# Patient Record
Sex: Male | Born: 1945 | Race: White | Hispanic: No | Marital: Married | Smoking: Current every day smoker
Health system: Southern US, Community
[De-identification: ages and names within clinical notes are randomized; demographics above are authoritative.]

## PROBLEM LIST (undated history)

## (undated) DIAGNOSIS — I10 Essential (primary) hypertension: Secondary | ICD-10-CM

## (undated) DIAGNOSIS — F0390 Unspecified dementia without behavioral disturbance: Secondary | ICD-10-CM

## (undated) DIAGNOSIS — J449 Chronic obstructive pulmonary disease, unspecified: Secondary | ICD-10-CM

## (undated) DIAGNOSIS — N289 Disorder of kidney and ureter, unspecified: Secondary | ICD-10-CM

## (undated) DIAGNOSIS — E785 Hyperlipidemia, unspecified: Secondary | ICD-10-CM

## (undated) HISTORY — PX: COLONOSCOPY W/ POLYPECTOMY: SHX1380

## (undated) HISTORY — PX: KNEE SURGERY: SHX244

## (undated) HISTORY — PX: VASECTOMY: SHX75

## (undated) HISTORY — PX: ABDOMINAL AORTIC ANEURYSM REPAIR: SUR1152

---

## 1998-11-29 ENCOUNTER — Emergency Department (HOSPITAL_COMMUNITY): Admission: EM | Admit: 1998-11-29 | Discharge: 1998-11-29 | Payer: Self-pay | Admitting: *Deleted

## 1998-12-30 ENCOUNTER — Encounter: Admission: RE | Admit: 1998-12-30 | Discharge: 1999-03-30 | Payer: Self-pay | Admitting: Anesthesiology

## 2009-01-04 ENCOUNTER — Emergency Department (HOSPITAL_BASED_OUTPATIENT_CLINIC_OR_DEPARTMENT_OTHER): Admission: EM | Admit: 2009-01-04 | Discharge: 2009-01-04 | Payer: Self-pay | Admitting: Emergency Medicine

## 2009-06-10 ENCOUNTER — Ambulatory Visit: Payer: Self-pay | Admitting: Radiology

## 2009-06-10 ENCOUNTER — Emergency Department (HOSPITAL_BASED_OUTPATIENT_CLINIC_OR_DEPARTMENT_OTHER): Admission: EM | Admit: 2009-06-10 | Discharge: 2009-06-10 | Payer: Self-pay | Admitting: Emergency Medicine

## 2010-08-09 ENCOUNTER — Ambulatory Visit: Payer: Self-pay | Admitting: Radiology

## 2010-08-09 ENCOUNTER — Emergency Department (HOSPITAL_BASED_OUTPATIENT_CLINIC_OR_DEPARTMENT_OTHER): Admission: EM | Admit: 2010-08-09 | Discharge: 2010-08-09 | Payer: Self-pay | Admitting: Emergency Medicine

## 2010-08-11 ENCOUNTER — Emergency Department (HOSPITAL_BASED_OUTPATIENT_CLINIC_OR_DEPARTMENT_OTHER): Admission: EM | Admit: 2010-08-11 | Discharge: 2010-08-11 | Payer: Self-pay | Admitting: Emergency Medicine

## 2011-01-27 LAB — BASIC METABOLIC PANEL
BUN: 20 mg/dL (ref 6–23)
Calcium: 9.7 mg/dL (ref 8.4–10.5)
Creatinine, Ser: 0.9 mg/dL (ref 0.4–1.5)
GFR calc Af Amer: >60 mL/min (ref >60)

## 2011-01-27 LAB — CBC
Platelets: 373 10*3/uL (ref 150–400)
RBC: 5.15 MIL/uL (ref 4.22–5.81)
RDW: 12.6 % (ref 11.5–15.5)
WBC: 10.3 10*3/uL (ref 4.0–10.5)

## 2011-01-27 LAB — HEPATIC FUNCTION PANEL
ALT: 19 U/L (ref 0–53)
Albumin: 3.7 g/dL (ref 3.5–5.2)
Alkaline Phosphatase: 38 U/L — ABNORMAL LOW (ref 39–117)
Total Bilirubin: 0.8 mg/dL (ref 0.3–1.2)
Total Protein: 7.3 g/dL (ref 6.0–8.3)

## 2011-01-27 LAB — URINALYSIS, ROUTINE W REFLEX MICROSCOPIC
Bilirubin Urine: NEGATIVE
Ketones, ur: NEGATIVE mg/dL
Nitrite: NEGATIVE
Urobilinogen, UA: 0.2 mg/dL (ref 0.0–1.0)
pH: 6 (ref 5.0–8.0)

## 2011-01-27 LAB — LIPASE, BLOOD: Lipase: 175 U/L (ref 23–300)

## 2011-01-27 LAB — POCT CARDIAC MARKERS
CKMB, poc: 1.1 ng/mL (ref 1.0–8.0)
Troponin i, poc: <0.05 ng/mL (ref 0.00–0.09)

## 2011-02-20 LAB — URINE MICROSCOPIC-ADD ON

## 2011-02-20 LAB — URINALYSIS, ROUTINE W REFLEX MICROSCOPIC
Bilirubin Urine: NEGATIVE
Leukocytes, UA: NEGATIVE
Nitrite: NEGATIVE
Specific Gravity, Urine: 1.022 (ref 1.005–1.030)
Urobilinogen, UA: 1 mg/dL (ref 0.0–1.0)
pH: 6.5 (ref 5.0–8.0)

## 2011-02-20 LAB — DIFFERENTIAL
Basophils Relative: 1 % (ref 0–1)
Lymphocytes Relative: 19 % (ref 12–46)
Lymphs Abs: 2.8 10*3/uL (ref 0.7–4.0)
Monocytes Absolute: 1.1 10*3/uL — ABNORMAL HIGH (ref 0.1–1.0)
Monocytes Relative: 8 % (ref 3–12)
Neutro Abs: 10.3 10*3/uL — ABNORMAL HIGH (ref 1.7–7.7)
Neutrophils Relative %: 71 % (ref 43–77)

## 2011-02-20 LAB — POCT TOXICOLOGY PANEL

## 2011-02-20 LAB — POCT CARDIAC MARKERS
CKMB, poc: 1 ng/mL (ref 1.0–8.0)
Myoglobin, poc: 47.7 ng/mL (ref 12–200)

## 2011-02-20 LAB — COMPREHENSIVE METABOLIC PANEL
Albumin: 3.6 g/dL (ref 3.5–5.2)
Alkaline Phosphatase: 60 U/L (ref 39–117)
BUN: 22 mg/dL (ref 6–23)
Calcium: 9.3 mg/dL (ref 8.4–10.5)
Creatinine, Ser: 0.9 mg/dL (ref 0.4–1.5)
Glucose, Bld: 78 mg/dL (ref 70–99)
Potassium: 4.1 mEq/L (ref 3.5–5.1)
Total Protein: 6.7 g/dL (ref 6.0–8.3)

## 2011-02-20 LAB — CBC
HCT: 46.8 % (ref 39.0–52.0)
Hemoglobin: 16 g/dL (ref 13.0–17.0)
MCHC: 34.2 g/dL (ref 30.0–36.0)
MCV: 93.2 fL (ref 78.0–100.0)
Platelets: 315 10*3/uL (ref 150–400)
RDW: 12.7 % (ref 11.5–15.5)

## 2011-02-20 LAB — APTT: aPTT: 34 seconds (ref 24–37)

## 2011-02-20 LAB — PROTIME-INR: INR: 1 (ref 0.00–1.49)

## 2011-03-01 LAB — URINE MICROSCOPIC-ADD ON

## 2011-03-01 LAB — URINALYSIS, ROUTINE W REFLEX MICROSCOPIC
Glucose, UA: NEGATIVE mg/dL
Leukocytes, UA: NEGATIVE
pH: 6 (ref 5.0–8.0)

## 2011-05-12 ENCOUNTER — Emergency Department (HOSPITAL_BASED_OUTPATIENT_CLINIC_OR_DEPARTMENT_OTHER)
Admission: EM | Admit: 2011-05-12 | Discharge: 2011-05-12 | Disposition: A | Discharge status: Home / Self Care | Payer: BC Managed Care – PPO | Attending: Emergency Medicine | Admitting: Emergency Medicine

## 2011-05-12 DIAGNOSIS — T783XXA Angioneurotic edema, initial encounter: Secondary | ICD-10-CM | POA: Insufficient documentation

## 2011-05-12 DIAGNOSIS — R220 Localized swelling, mass and lump, head: Secondary | ICD-10-CM | POA: Insufficient documentation

## 2011-05-12 DIAGNOSIS — F341 Dysthymic disorder: Secondary | ICD-10-CM | POA: Insufficient documentation

## 2011-05-12 DIAGNOSIS — Z79899 Other long term (current) drug therapy: Secondary | ICD-10-CM | POA: Insufficient documentation

## 2011-05-12 DIAGNOSIS — Y92009 Unspecified place in unspecified non-institutional (private) residence as the place of occurrence of the external cause: Secondary | ICD-10-CM | POA: Insufficient documentation

## 2011-05-12 DIAGNOSIS — E785 Hyperlipidemia, unspecified: Secondary | ICD-10-CM | POA: Insufficient documentation

## 2011-05-12 DIAGNOSIS — I10 Essential (primary) hypertension: Secondary | ICD-10-CM | POA: Insufficient documentation

## 2011-05-12 DIAGNOSIS — X58XXXA Exposure to other specified factors, initial encounter: Secondary | ICD-10-CM | POA: Insufficient documentation

## 2013-01-20 ENCOUNTER — Emergency Department (HOSPITAL_BASED_OUTPATIENT_CLINIC_OR_DEPARTMENT_OTHER)
Admission: EM | Admit: 2013-01-20 | Discharge: 2013-01-20 | Disposition: A | Discharge status: Home / Self Care | Payer: Medicare Other | Attending: Emergency Medicine | Admitting: Emergency Medicine

## 2013-01-20 ENCOUNTER — Emergency Department (HOSPITAL_BASED_OUTPATIENT_CLINIC_OR_DEPARTMENT_OTHER): Payer: Medicare Other

## 2013-01-20 ENCOUNTER — Encounter (HOSPITAL_BASED_OUTPATIENT_CLINIC_OR_DEPARTMENT_OTHER): Payer: Self-pay

## 2013-01-20 DIAGNOSIS — Z7982 Long term (current) use of aspirin: Secondary | ICD-10-CM | POA: Insufficient documentation

## 2013-01-20 DIAGNOSIS — R112 Nausea with vomiting, unspecified: Secondary | ICD-10-CM | POA: Insufficient documentation

## 2013-01-20 DIAGNOSIS — E785 Hyperlipidemia, unspecified: Secondary | ICD-10-CM | POA: Insufficient documentation

## 2013-01-20 DIAGNOSIS — Z79899 Other long term (current) drug therapy: Secondary | ICD-10-CM | POA: Insufficient documentation

## 2013-01-20 DIAGNOSIS — R109 Unspecified abdominal pain: Secondary | ICD-10-CM | POA: Insufficient documentation

## 2013-01-20 DIAGNOSIS — F17200 Nicotine dependence, unspecified, uncomplicated: Secondary | ICD-10-CM | POA: Insufficient documentation

## 2013-01-20 DIAGNOSIS — I10 Essential (primary) hypertension: Secondary | ICD-10-CM | POA: Insufficient documentation

## 2013-01-20 HISTORY — DX: Hyperlipidemia, unspecified: E78.5

## 2013-01-20 HISTORY — DX: Essential (primary) hypertension: I10

## 2013-01-20 LAB — URINALYSIS, ROUTINE W REFLEX MICROSCOPIC
Glucose, UA: NEGATIVE mg/dL
Leukocytes, UA: NEGATIVE
pH: 6 (ref 5.0–8.0)

## 2013-01-20 LAB — URINE MICROSCOPIC-ADD ON

## 2013-01-20 LAB — CBC WITH DIFFERENTIAL/PLATELET
Basophils Absolute: 0.1 10*3/uL (ref 0.0–0.1)
HCT: 48.4 % (ref 39.0–52.0)
Hemoglobin: 17.1 g/dL — ABNORMAL HIGH (ref 13.0–17.0)
Lymphocytes Relative: 29 % (ref 12–46)
Monocytes Absolute: 1.8 10*3/uL — ABNORMAL HIGH (ref 0.1–1.0)
Monocytes Relative: 13 % — ABNORMAL HIGH (ref 3–12)
Neutro Abs: 8 10*3/uL — ABNORMAL HIGH (ref 1.7–7.7)
RBC: 5.16 MIL/uL (ref 4.22–5.81)
RDW: 14.2 % (ref 11.5–15.5)
WBC: 14.3 10*3/uL — ABNORMAL HIGH (ref 4.0–10.5)

## 2013-01-20 LAB — BASIC METABOLIC PANEL
CO2: 25 mEq/L (ref 19–32)
Chloride: 103 mEq/L (ref 96–112)
Creatinine, Ser: 0.9 mg/dL (ref 0.50–1.35)

## 2013-01-20 LAB — HEPATIC FUNCTION PANEL
AST: 28 U/L (ref 0–37)
Bilirubin, Direct: 0.1 mg/dL (ref 0.0–0.3)
Indirect Bilirubin: 0.4 mg/dL (ref 0.3–0.9)

## 2013-01-20 LAB — LIPASE, BLOOD: Lipase: 51 U/L (ref 11–59)

## 2013-01-20 MED ORDER — PROMETHAZINE HCL 25 MG PO TABS: 25.0000 mg | ORAL_TABLET | Freq: Four times a day (QID) | ORAL | Status: DC | PRN

## 2013-01-20 MED ORDER — FENTANYL CITRATE 0.05 MG/ML IJ SOLN
INTRAMUSCULAR | Status: AC
  Administered 2013-01-20: 100 ug via INTRAVENOUS
  Filled 2013-01-20: qty 2

## 2013-01-20 MED ORDER — HYDROMORPHONE HCL PF 1 MG/ML IJ SOLN: 1.0000 mg | Freq: Once | INTRAMUSCULAR | Status: AC

## 2013-01-20 MED ORDER — HYDROCODONE-ACETAMINOPHEN 5-325 MG PO TABS: 1.0000 | ORAL_TABLET | Freq: Four times a day (QID) | ORAL | Status: DC | PRN

## 2013-01-20 MED ORDER — SODIUM CHLORIDE 0.9 % IV BOLUS (SEPSIS)
1000.0000 mL | Freq: Once | INTRAVENOUS | Status: AC
  Administered 2013-01-20: 1000 mL via INTRAVENOUS

## 2013-01-20 MED ORDER — ONDANSETRON HCL 4 MG/2ML IJ SOLN: 4.0000 mg | Freq: Once | INTRAMUSCULAR | Status: AC

## 2013-01-20 MED ORDER — HYDROMORPHONE HCL PF 1 MG/ML IJ SOLN
INTRAMUSCULAR | Status: AC
  Administered 2013-01-20: 1 mg via INTRAVENOUS
  Filled 2013-01-20: qty 1

## 2013-01-20 MED ORDER — IOHEXOL 300 MG/ML  SOLN
100.0000 mL | Freq: Once | INTRAMUSCULAR | Status: AC | PRN
  Administered 2013-01-20: 100 mL via INTRAVENOUS

## 2013-01-20 MED ORDER — FENTANYL CITRATE 0.05 MG/ML IJ SOLN: 100.0000 ug | Freq: Once | INTRAMUSCULAR | Status: AC

## 2013-01-20 MED ORDER — ONDANSETRON HCL 4 MG/2ML IJ SOLN
INTRAMUSCULAR | Status: AC
  Administered 2013-01-20: 4 mg via INTRAVENOUS
  Filled 2013-01-20: qty 2

## 2013-01-20 MED ORDER — DIPHENHYDRAMINE HCL 50 MG/ML IJ SOLN
INTRAMUSCULAR | Status: AC
  Administered 2013-01-20: 12.5 mg via INTRAVENOUS
  Filled 2013-01-20: qty 1

## 2013-01-20 MED ORDER — DIPHENHYDRAMINE HCL 50 MG/ML IJ SOLN: 12.5000 mg | Freq: Once | INTRAMUSCULAR | Status: AC

## 2013-01-20 MED ORDER — IOHEXOL 300 MG/ML  SOLN
50.0000 mL | Freq: Once | INTRAMUSCULAR | Status: AC | PRN
  Administered 2013-01-20: 50 mL via ORAL

## 2013-01-20 NOTE — ED Provider Notes (Signed)
Medical screening examination/treatment/procedure(s) were performed by non-physician practitioner and as supervising physician I was immediately available for consultation/collaboration.  Douglas Delo, MD 01/20/13 1837 

## 2013-01-20 NOTE — ED Notes (Signed)
Chris, PA-C at bedside 

## 2013-01-20 NOTE — ED Notes (Signed)
Pt states that he has severe abdominal pain on the R side.  Pt states that he has nausea, but denies vomiting, diarrhea.  Pt states that he has chronic constipation.  Pts last BM was this morning.  Denies dysuria, hematuria.

## 2013-01-20 NOTE — ED Provider Notes (Signed)
History     CSN: [id number]  Arrival date & time 01/20/13  1106   First MD Initiated Contact with Patient 01/20/13 1200      Chief Complaint  Patient presents with  . Abdominal Pain    (Consider location/radiation/quality/duration/timing/severity/associated sxs/prior treatment) HPI Patient presents emergency department with mid abdominal pain, that began yesterday.  Patient, states, that he had a significant bowel movement this morning.  Patient, states he says, nausea and vomiting today, as well.  Patient denies headache, blurred vision, fever, or weakness, diarrhea, chest pain, shortness of breath, back pain, hematuria, or blood in stools.  Patient, states he did not take anything prior to arrival for his symptoms.  Patient, states, that movement and palpation seems make his pain, worse Past Medical History  Diagnosis Date  . Hyperlipidemia   . Hypertension     Past Surgical History  Procedure Laterality Date  . Abdominal aortic aneurysm repair    . Knee surgery      R knee  . Vasectomy    . Colonoscopy w/ polypectomy      No family history on file.  History  Substance Use Topics  . Smoking status: Current Every Day Smoker -- 2.00 packs/day    Types: Cigarettes  . Smokeless tobacco: Never Used  . Alcohol Use: No      Review of Systems All other systems negative except as documented in the HPI. All pertinent positives and negatives as reviewed in the HPI. Allergies  Penicillins  Home Medications   Current Outpatient Rx  Name  Route  Sig  Dispense  Refill  . aspirin 81 MG tablet   Oral   Take 81 mg by mouth daily.         . bisoprolol-hydrochlorothiazide (ZIAC) 5-6.25 MG per tablet   Oral   Take 1 tablet by mouth daily.         . clonazePAM (KLONOPIN) 0.5 MG tablet   Oral   Take 0.5 mg by mouth 3 (three) times daily as needed for anxiety.         . docusate sodium (COLACE) 50 MG capsule   Oral   Take by mouth 2 (two) times daily.         .  fish oil-omega-3 fatty acids 1000 MG capsule   Oral   Take 2 g by mouth daily.         Marland Kitchen glucosamine-chondroitin 500-400 MG tablet   Oral   Take 1 tablet by mouth 3 (three) times daily.         . Multiple Vitamins-Minerals (CENTRUM PO)   Oral   Take by mouth.         . polyethylene glycol (MIRALAX / GLYCOLAX) packet   Oral   Take 17 g by mouth daily.         . pravastatin (PRAVACHOL) 20 MG tablet   Oral   Take 20 mg by mouth daily.         . ranitidine (ZANTAC) 150 MG tablet   Oral   Take 150 mg by mouth 2 (two) times daily.         . sertraline (ZOLOFT) 25 MG tablet   Oral   Take 25 mg by mouth daily.           BP 116/80  Pulse 63  Temp(Src) 98.9 F (37.2 C) (Oral)  Resp 16  Ht 6\' 4"  (1.93 m)  Wt 278 lb (126.1 kg)  BMI 33.85 kg/m2  SpO2  96%  Physical Exam  Constitutional: He is oriented to person, place, and time. He appears well-developed and well-nourished.  HENT:  Head: Normocephalic and atraumatic.  Mouth/Throat: Oropharynx is clear and moist.  Eyes: Pupils are equal, round, and reactive to light.  Neck: Normal range of motion. Neck supple.  Cardiovascular: Normal rate, regular rhythm and normal heart sounds.  Exam reveals no gallop and no friction rub.   No murmur heard. Pulmonary/Chest: Effort normal and breath sounds normal.  Abdominal: Soft. Normal appearance and bowel sounds are normal. He exhibits no distension. There is tenderness. There is no rigidity, no rebound and no guarding.    Neurological: He is alert and oriented to person, place, and time.  Skin: Skin is warm and dry. No rash noted. No erythema.    ED Course  Procedures (including critical care time)  Labs Reviewed  URINALYSIS, ROUTINE W REFLEX MICROSCOPIC - Abnormal; Notable for the following:    Hgb urine dipstick SMALL (*)    Protein, ur 30 (*)    All other components within normal limits  CBC WITH DIFFERENTIAL - Abnormal; Notable for the following:    WBC 14.3  (*)    Hemoglobin 17.1 (*)    Neutro Abs 8.0 (*)    Lymphs Abs 4.1 (*)    Monocytes Relative 13 (*)    Monocytes Absolute 1.8 (*)    All other components within normal limits  BASIC METABOLIC PANEL - Abnormal; Notable for the following:    Glucose, Bld 116 (*)    GFR calc non Af Amer 88 (*)    All other components within normal limits  LIPASE, BLOOD  URINE MICROSCOPIC-ADD ON  HEPATIC FUNCTION PANEL   Ct Abdomen Pelvis W Contrast  01/20/2013  *RADIOLOGY REPORT*  Clinical Data: Right-sided pain.  Elevated white blood cell count. Chronic constipation.  CT ABDOMEN AND PELVIS WITH CONTRAST  Technique:  Multidetector CT imaging of the abdomen and pelvis was performed following the standard protocol during bolus administration of intravenous contrast.  Contrast: 50mL OMNIPAQUE IOHEXOL 300 MG/ML  SOLN, OMNIPAQUE IOHEXOL 300 MG/ML  SOLN  Comparison: 06/10/2009  Findings: Lung bases:  Motion degradation at the lung bases. Normal heart size without pericardial or pleural effusion.  Abdomen/pelvis:  Hepatic dome minimally excluded.  Small low- density liver lesions are unchanged and likely cysts. Hepatomegaly, greater than 21 cm cranial caudal.  Normal spleen, stomach, pancreas, gallbladder, biliary tract. Moderate bilateral adrenal thickening / nodularity is chronic. Normal kidneys.  Retroaortic left renal vein.  Infrarenal abdominal aortic aneurysm which measures maximally 4.0 cm on image 50/series 2.  Maximally 3.9 cm at the same level on the prior.  Surgical changes in this region.  No surrounding hemorrhage. No retroperitoneal or retrocrural adenopathy.  Prominent porta hepatis nodes, including a portacaval 2.0 cm on image 25/series 2 similar.  Colonic stool burden suggests constipation.  Normal terminal ileum and appendix.  Fluid filled small bowel loops measure up to 3.7 cm, including on image 64/series 2.  No focal transition point identified. No ascites.  Aneurysmal dilatation of the right common  iliac artery is unchanged 2.1 cm.  The left common iliac is aneurysmal at 2.9 cm, unchanged. Fat containing right inguinal hernia. No pelvic adenopathy. Normal urinary bladder and prostate.  No significant free fluid.  Bones/Musculoskeletal:  No acute osseous abnormality.  IMPRESSION:  1. Possible constipation. 2.  Mildly dilated mid small bowel loops which are fluid filled. No focal transition point identified to suggest high-grade obstruction.  Favor mild adynamic ileus.  Low grade or intermittent partial small bowel obstruction could look similar. 3.  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  Hepatomegaly. 5.  Motion degradation superiorly.   Original Report Authenticated By: Jeronimo Greaves, M.D.     The patient will be treated for constipation and told to take a liquid diet for the next 24 hours.  Patient's told to slowly increase his fluid intake.  Patient is rechecked x2.  Patient, states, that he's feeling completely for the symptoms at this time.  I advised him to followup with his primary care Dr. for recheck.  Told to return here for any worsening in his condition  MDM  MDM Reviewed: vitals and nursing note Interpretation: labs and CT scan            Carlyle Dolly, PA-C 01/20/13 1620

## 2013-11-17 ENCOUNTER — Encounter (HOSPITAL_BASED_OUTPATIENT_CLINIC_OR_DEPARTMENT_OTHER): Payer: Self-pay | Admitting: Emergency Medicine

## 2013-11-17 ENCOUNTER — Inpatient Hospital Stay (HOSPITAL_BASED_OUTPATIENT_CLINIC_OR_DEPARTMENT_OTHER)
Admission: EM | Admit: 2013-11-17 | Discharge: 2013-11-18 | DRG: 392 | Disposition: A | Discharge status: Home / Self Care | Payer: Medicare HMO | Attending: Family Medicine | Admitting: Family Medicine

## 2013-11-17 ENCOUNTER — Emergency Department (HOSPITAL_BASED_OUTPATIENT_CLINIC_OR_DEPARTMENT_OTHER): Payer: Medicare HMO

## 2013-11-17 DIAGNOSIS — E785 Hyperlipidemia, unspecified: Secondary | ICD-10-CM | POA: Diagnosis present

## 2013-11-17 DIAGNOSIS — K5289 Other specified noninfective gastroenteritis and colitis: Principal | ICD-10-CM | POA: Diagnosis present

## 2013-11-17 DIAGNOSIS — I10 Essential (primary) hypertension: Secondary | ICD-10-CM

## 2013-11-17 DIAGNOSIS — Z880 Allergy status to penicillin: Secondary | ICD-10-CM

## 2013-11-17 DIAGNOSIS — Z7982 Long term (current) use of aspirin: Secondary | ICD-10-CM

## 2013-11-17 DIAGNOSIS — K529 Noninfective gastroenteritis and colitis, unspecified: Secondary | ICD-10-CM | POA: Diagnosis present

## 2013-11-17 DIAGNOSIS — R109 Unspecified abdominal pain: Secondary | ICD-10-CM

## 2013-11-17 DIAGNOSIS — F17200 Nicotine dependence, unspecified, uncomplicated: Secondary | ICD-10-CM | POA: Diagnosis present

## 2013-11-17 DIAGNOSIS — F03918 Unspecified dementia, unspecified severity, with other behavioral disturbance: Secondary | ICD-10-CM

## 2013-11-17 DIAGNOSIS — F0391 Unspecified dementia with behavioral disturbance: Secondary | ICD-10-CM | POA: Diagnosis present

## 2013-11-17 DIAGNOSIS — Z79899 Other long term (current) drug therapy: Secondary | ICD-10-CM

## 2013-11-17 HISTORY — DX: Unspecified dementia, unspecified severity, without behavioral disturbance, psychotic disturbance, mood disturbance, and anxiety: F03.90

## 2013-11-17 LAB — CBC WITH DIFFERENTIAL/PLATELET
BASOS PCT: 0 % (ref 0–1)
Basophils Absolute: 0 10*3/uL (ref 0.0–0.1)
EOS ABS: 0.1 10*3/uL (ref 0.0–0.7)
Eosinophils Relative: 1 % (ref 0–5)
HEMATOCRIT: 49.6 % (ref 39.0–52.0)
HEMOGLOBIN: 17 g/dL (ref 13.0–17.0)
LYMPHS ABS: 2.9 10*3/uL (ref 0.7–4.0)
Lymphocytes Relative: 18 % (ref 12–46)
MCH: 33.5 pg (ref 26.0–34.0)
MCHC: 34.3 g/dL (ref 30.0–36.0)
MCV: 97.8 fL (ref 78.0–100.0)
MONO ABS: 1 10*3/uL (ref 0.1–1.0)
MONOS PCT: 7 % (ref 3–12)
NEUTROS ABS: 12 10*3/uL — AB (ref 1.7–7.7)
Neutrophils Relative %: 74 % (ref 43–77)
Platelets: 350 10*3/uL (ref 150–400)
RBC: 5.07 MIL/uL (ref 4.22–5.81)
RDW: 13.5 % (ref 11.5–15.5)
WBC: 16.1 10*3/uL — ABNORMAL HIGH (ref 4.0–10.5)

## 2013-11-17 LAB — COMPREHENSIVE METABOLIC PANEL
ALBUMIN: 4.4 g/dL (ref 3.5–5.2)
ALK PHOS: 64 U/L (ref 39–117)
ALT: 26 U/L (ref 0–53)
AST: 23 U/L (ref 0–37)
BUN: 26 mg/dL — AB (ref 6–23)
CHLORIDE: 103 meq/L (ref 96–112)
CO2: 22 meq/L (ref 19–32)
Calcium: 10 mg/dL (ref 8.4–10.5)
Creatinine, Ser: 1.3 mg/dL (ref 0.50–1.35)
GFR calc Af Amer: 65 mL/min — ABNORMAL LOW (ref >90)
GFR calc non Af Amer: 56 mL/min — ABNORMAL LOW (ref >90)
GLUCOSE: 148 mg/dL — AB (ref 70–99)
Potassium: 3.7 mEq/L (ref 3.7–5.3)
Sodium: 141 mEq/L (ref 137–147)
TOTAL PROTEIN: 8.2 g/dL (ref 6.0–8.3)
Total Bilirubin: 0.3 mg/dL (ref 0.3–1.2)

## 2013-11-17 LAB — URINALYSIS, ROUTINE W REFLEX MICROSCOPIC
BILIRUBIN URINE: NEGATIVE
GLUCOSE, UA: NEGATIVE mg/dL
Hgb urine dipstick: NEGATIVE
KETONES UR: NEGATIVE mg/dL
Leukocytes, UA: NEGATIVE
Nitrite: NEGATIVE
PH: 6 (ref 5.0–8.0)
PROTEIN: NEGATIVE mg/dL
Specific Gravity, Urine: 1.025 (ref 1.005–1.030)
Urobilinogen, UA: 0.2 mg/dL (ref 0.0–1.0)

## 2013-11-17 LAB — CBC
HEMATOCRIT: 45.7 % (ref 39.0–52.0)
Hemoglobin: 15.5 g/dL (ref 13.0–17.0)
MCH: 33.8 pg (ref 26.0–34.0)
MCHC: 33.9 g/dL (ref 30.0–36.0)
MCV: 99.8 fL (ref 78.0–100.0)
Platelets: 333 10*3/uL (ref 150–400)
RBC: 4.58 MIL/uL (ref 4.22–5.81)
RDW: 13.6 % (ref 11.5–15.5)
WBC: 14.5 10*3/uL — ABNORMAL HIGH (ref 4.0–10.5)

## 2013-11-17 LAB — CREATININE, SERUM
Creatinine, Ser: 1.25 mg/dL (ref 0.50–1.35)
GFR calc non Af Amer: 59 mL/min — ABNORMAL LOW (ref >90)
GFR, EST AFRICAN AMERICAN: 68 mL/min — AB (ref >90)

## 2013-11-17 LAB — LIPASE, BLOOD: Lipase: 46 U/L (ref 11–59)

## 2013-11-17 LAB — CG4 I-STAT (LACTIC ACID): Lactic Acid, Venous: 1.66 mmol/L (ref 0.5–2.2)

## 2013-11-17 MED ORDER — BISOPROLOL-HYDROCHLOROTHIAZIDE 5-6.25 MG PO TABS
1.0000 | ORAL_TABLET | Freq: Every day | ORAL | Status: DC
  Administered 2013-11-17 – 2013-11-18 (×2): 1 via ORAL
  Filled 2013-11-17 (×2): qty 1

## 2013-11-17 MED ORDER — KCL IN DEXTROSE-NACL 20-5-0.45 MEQ/L-%-% IV SOLN
INTRAVENOUS | Status: DC
  Administered 2013-11-17 – 2013-11-18 (×2): via INTRAVENOUS
  Filled 2013-11-17 (×3): qty 1000

## 2013-11-17 MED ORDER — FAMOTIDINE 20 MG PO TABS
20.0000 mg | ORAL_TABLET | Freq: Every day | ORAL | Status: DC
  Administered 2013-11-17 – 2013-11-18 (×2): 20 mg via ORAL
  Filled 2013-11-17 (×2): qty 1

## 2013-11-17 MED ORDER — FENTANYL CITRATE 0.05 MG/ML IJ SOLN
100.0000 ug | Freq: Once | INTRAMUSCULAR | Status: AC
  Administered 2013-11-17: 100 ug via INTRAVENOUS
  Filled 2013-11-17: qty 2

## 2013-11-17 MED ORDER — MORPHINE SULFATE 2 MG/ML IJ SOLN: 1.0000 mg | INTRAMUSCULAR | Status: DC | PRN

## 2013-11-17 MED ORDER — MEMANTINE HCL ER 7 MG PO CP24
28.0000 mg | ORAL_CAPSULE | Freq: Every day | ORAL | Status: DC
  Administered 2013-11-17 – 2013-11-18 (×2): 28 mg via ORAL
  Filled 2013-11-17 (×2): qty 4

## 2013-11-17 MED ORDER — ONDANSETRON HCL 4 MG/2ML IJ SOLN: 4.0000 mg | Freq: Four times a day (QID) | INTRAMUSCULAR | Status: DC | PRN

## 2013-11-17 MED ORDER — MEMANTINE HCL ER 28 MG PO CP24: 28.0000 mg | ORAL_CAPSULE | Freq: Every day | ORAL | Status: DC

## 2013-11-17 MED ORDER — HEPARIN SODIUM (PORCINE) 5000 UNIT/ML IJ SOLN
5000.0000 [IU] | Freq: Three times a day (TID) | INTRAMUSCULAR | Status: DC
  Administered 2013-11-17 – 2013-11-18 (×4): 5000 [IU] via SUBCUTANEOUS
  Filled 2013-11-17 (×6): qty 1

## 2013-11-17 MED ORDER — CLONAZEPAM 1 MG PO TABS
1.0000 mg | ORAL_TABLET | Freq: Three times a day (TID) | ORAL | Status: DC | PRN
  Filled 2013-11-17: qty 1

## 2013-11-17 MED ORDER — BUPROPION HCL ER (SR) 150 MG PO TB12
150.0000 mg | ORAL_TABLET | Freq: Every day | ORAL | Status: DC
  Administered 2013-11-17 – 2013-11-18 (×2): 150 mg via ORAL
  Filled 2013-11-17 (×2): qty 1

## 2013-11-17 MED ORDER — SODIUM CHLORIDE 0.9 % IV SOLN
INTRAVENOUS | Status: DC
  Administered 2013-11-17: 04:00:00 via INTRAVENOUS

## 2013-11-17 MED ORDER — DOCUSATE SODIUM 100 MG PO CAPS
100.0000 mg | ORAL_CAPSULE | Freq: Two times a day (BID) | ORAL | Status: DC | PRN
  Filled 2013-11-17: qty 1

## 2013-11-17 MED ORDER — IOHEXOL 300 MG/ML  SOLN
100.0000 mL | Freq: Once | INTRAMUSCULAR | Status: AC | PRN
  Administered 2013-11-17: 100 mL via INTRAVENOUS

## 2013-11-17 MED ORDER — CIPROFLOXACIN IN D5W 400 MG/200ML IV SOLN
400.0000 mg | Freq: Two times a day (BID) | INTRAVENOUS | Status: DC
  Administered 2013-11-17 – 2013-11-18 (×2): 400 mg via INTRAVENOUS
  Filled 2013-11-17 (×3): qty 200

## 2013-11-17 MED ORDER — METRONIDAZOLE IN NACL 5-0.79 MG/ML-% IV SOLN
500.0000 mg | Freq: Three times a day (TID) | INTRAVENOUS | Status: DC
  Administered 2013-11-17 – 2013-11-18 (×3): 500 mg via INTRAVENOUS
  Filled 2013-11-17 (×5): qty 100

## 2013-11-17 MED ORDER — SIMVASTATIN 10 MG PO TABS
10.0000 mg | ORAL_TABLET | Freq: Every day | ORAL | Status: DC
  Administered 2013-11-17: 10 mg via ORAL
  Filled 2013-11-17 (×2): qty 1

## 2013-11-17 MED ORDER — PROMETHAZINE HCL 25 MG/ML IJ SOLN
12.5000 mg | Freq: Once | INTRAMUSCULAR | Status: AC
  Administered 2013-11-17: 12.5 mg via INTRAVENOUS
  Filled 2013-11-17: qty 1

## 2013-11-17 MED ORDER — MEMANTINE HCL ER 7 & 14 & 21 &28 MG PO CP24: 28.0000 mg | ORAL_CAPSULE | Freq: Every day | ORAL | Status: DC

## 2013-11-17 MED ORDER — ONDANSETRON HCL 4 MG PO TABS: 4.0000 mg | ORAL_TABLET | Freq: Four times a day (QID) | ORAL | Status: DC | PRN

## 2013-11-17 MED ORDER — POLYETHYLENE GLYCOL 3350 17 G PO PACK
17.0000 g | PACK | Freq: Every day | ORAL | Status: DC
  Administered 2013-11-17 – 2013-11-18 (×2): 17 g via ORAL
  Filled 2013-11-17 (×2): qty 1

## 2013-11-17 MED ORDER — ASPIRIN 81 MG PO TABS: 81.0000 mg | ORAL_TABLET | Freq: Every day | ORAL | Status: DC

## 2013-11-17 MED ORDER — HYDROCODONE-ACETAMINOPHEN 5-325 MG PO TABS
1.0000 | ORAL_TABLET | ORAL | Status: DC | PRN
  Administered 2013-11-17 – 2013-11-18 (×4): 2 via ORAL
  Filled 2013-11-17 (×4): qty 2

## 2013-11-17 MED ORDER — ASPIRIN EC 81 MG PO TBEC
81.0000 mg | DELAYED_RELEASE_TABLET | Freq: Every day | ORAL | Status: DC
  Administered 2013-11-17 – 2013-11-18 (×2): 81 mg via ORAL
  Filled 2013-11-17 (×2): qty 1

## 2013-11-17 MED ORDER — ONDANSETRON HCL 4 MG/2ML IJ SOLN
4.0000 mg | Freq: Once | INTRAMUSCULAR | Status: AC
  Administered 2013-11-17: 4 mg via INTRAVENOUS
  Filled 2013-11-17: qty 2

## 2013-11-17 MED ORDER — CIPROFLOXACIN IN D5W 400 MG/200ML IV SOLN
400.0000 mg | Freq: Once | INTRAVENOUS | Status: AC
  Administered 2013-11-17: 400 mg via INTRAVENOUS
  Filled 2013-11-17: qty 200

## 2013-11-17 NOTE — ED Notes (Addendum)
C/o general upper abd pain that started last night. C/o nausea. Denies vomiting. Took phenergan around 2200. Denies diarrhea. Fevers unknown. C/o abd pain that is constant and describes as constant. Hx of AAA repair approx 15 years ago. C/o right leg swelling for past month. Denies any urinary symptoms

## 2013-11-17 NOTE — ED Notes (Signed)
MD at bedside discussing test results and plan of care. 

## 2013-11-17 NOTE — Progress Notes (Signed)
Pt's wife called and let me know about a few more meds he is on. Pt is also on Aricept HCL 10 mg HS, Lasix 40 mg once a day, Lexapro 20 mg once a day.  Wife also said pt is on 1 mg clonopin not 0.5 mg. Wife also requests pt to have ted hose on since he normally wears them on his right leg.

## 2013-11-17 NOTE — ED Notes (Signed)
Patient transported to X-ray 

## 2013-11-17 NOTE — ED Notes (Signed)
[facility]ia [facility]--spoke with Robert Wilson 

## 2013-11-17 NOTE — ED Notes (Signed)
MD at bedside discussing test results. CT abd to be done.

## 2013-11-17 NOTE — ED Notes (Signed)
MD at bedside. 

## 2013-11-17 NOTE — ED Provider Notes (Signed)
CSN: [id number]     Arrival date & time 11/17/13  0303 History   First MD Initiated Contact with Patient 11/17/13 0321     Chief Complaint  Patient presents with  . Abdominal Pain   (Consider location/radiation/quality/duration/timing/severity/associated sxs/prior Treatment) HPI This is a 68 year old male status post AAA repair with known ventral hernias. He is here with abdominal pain that began yesterday evening about 10:30. Pain is poorly characterized as sharp and dull components. It is fairly generalized but more present in the upper abdomen in the lower abdomen. He had nausea and retching but this improved after taking Phenergan at home. His abdomen is somewhat more distended than usual. He rates his pain an 8/10. It is not significantly changed with movement or palpation. He denies shortness of breath or chest pain. He has edema of the right lower leg and foot that has been present for several weeks. He reportedly had a Doppler ultrasound that was negative for DVT.   Past Medical History  Diagnosis Date  . Hyperlipidemia   . Hypertension   . Dementia    Past Surgical History  Procedure Laterality Date  . Abdominal aortic aneurysm repair    . Knee surgery      R knee  . Vasectomy    . Colonoscopy w/ polypectomy     No family history on file. History  Substance Use Topics  . Smoking status: Current Every Day Smoker -- 2.00 packs/day    Types: Cigarettes  . Smokeless tobacco: Never Used  . Alcohol Use: No    Review of Systems  All other systems reviewed and are negative.    Allergies  Penicillins  Home Medications   Current Outpatient Rx  Name  Route  Sig  Dispense  Refill  . BuPROPion HCl (WELLBUTRIN PO)   Oral   Take by mouth.         . Memantine HCl (NAMENDA PO)   Oral   Take by mouth.         Marland Kitchen. aspirin 81 MG tablet   Oral   Take 81 mg by mouth daily.         . bisoprolol-hydrochlorothiazide (ZIAC) 5-6.25 MG per tablet   Oral   Take 1 tablet by  mouth daily.         . clonazePAM (KLONOPIN) 0.5 MG tablet   Oral   Take 0.5 mg by mouth 3 (three) times daily as needed for anxiety.         . docusate sodium (COLACE) 50 MG capsule   Oral   Take by mouth 2 (two) times daily.         . fish oil-omega-3 fatty acids 1000 MG capsule   Oral   Take 2 g by mouth daily.         Marland Kitchen. glucosamine-chondroitin 500-400 MG tablet   Oral   Take 1 tablet by mouth 3 (three) times daily.         Marland Kitchen. HYDROcodone-acetaminophen (NORCO/VICODIN) 5-325 MG per tablet   Oral   Take 1 tablet by mouth every 6 (six) hours as needed for pain.   15 tablet   0   . Multiple Vitamins-Minerals (CENTRUM PO)   Oral   Take by mouth.         . polyethylene glycol (MIRALAX / GLYCOLAX) packet   Oral   Take 17 g by mouth daily.         . pravastatin (PRAVACHOL) 20 MG tablet   Oral  Take 20 mg by mouth daily.         . promethazine (PHENERGAN) 25 MG tablet   Oral   Take 1 tablet (25 mg total) by mouth every 6 (six) hours as needed for nausea.   10 tablet   0   . ranitidine (ZANTAC) 150 MG tablet   Oral   Take 150 mg by mouth 2 (two) times daily.         . sertraline (ZOLOFT) 25 MG tablet   Oral   Take 25 mg by mouth daily.          BP 115/85  Pulse 69  Temp(Src) 97.4 F (36.3 C) (Oral)  Resp 16  Ht 6\' 4"  (1.93 m)  Wt 298 lb (135.172 kg)  BMI 36.29 kg/m2  SpO2 94%  Physical Exam General: Well-developed, well-nourished male in no acute distress; appearance consistent with age of record HENT: normocephalic; atraumatic Eyes: pupils equal, round and reactive to light; extraocular muscles intact Neck: supple Heart: regular rate and rhythm Lungs: clear to auscultation bilaterally Abdomen: soft; distended;  mild diffuse tendernessr;  ventral hernia more prominent when leaning forward; bowel sounds  high-pitched Extremities: No deformity; full range of motion;  edema of right lower leg Neurologic: Awake, alert; motor function  intact in all extremities and symmetric; no facial droop Skin: Warm and dry; chronic-appearing stasis changes of lower legs Psychiatric: Normal mood and affect    ED Course  Procedures (including critical care time)    MDM   Nursing notes and vitals signs, including pulse oximetry, reviewed.  Summary of this visit's results, reviewed by myself:  Labs:  Results for orders placed during the hospital encounter of 11/17/13 (from the past 24 hour(s))  COMPREHENSIVE METABOLIC PANEL     Status: Abnormal   Collection Time    11/17/13  3:30 AM      Result Value Range   Sodium 141  137 - 147 mEq/L   Potassium 3.7  3.7 - 5.3 mEq/L   Chloride 103  96 - 112 mEq/L   CO2 22  19 - 32 mEq/L   Glucose, Bld 148 (*) 70 - 99 mg/dL   BUN 26 (*) 6 - 23 mg/dL   Creatinine, Ser 1.61  0.50 - 1.35 mg/dL   Calcium 09.6  8.4 - 04.5 mg/dL   Total Protein 8.2  6.0 - 8.3 g/dL   Albumin 4.4  3.5 - 5.2 g/dL   AST 23  0 - 37 U/L   ALT 26  0 - 53 U/L   Alkaline Phosphatase 64  39 - 117 U/L   Total Bilirubin 0.3  0.3 - 1.2 mg/dL   GFR calc non Af Amer 56 (*) >90 mL/min   GFR calc Af Amer 65 (*) >90 mL/min  LIPASE, BLOOD     Status: None   Collection Time    11/17/13  3:30 AM      Result Value Range   Lipase 46  11 - 59 U/L  CBC WITH DIFFERENTIAL     Status: Abnormal   Collection Time    11/17/13  3:30 AM      Result Value Range   WBC 16.1 (*) 4.0 - 10.5 K/uL   RBC 5.07  4.22 - 5.81 MIL/uL   Hemoglobin 17.0  13.0 - 17.0 g/dL   HCT 40.9  81.1 - 91.4 %   MCV 97.8  78.0 - 100.0 fL   MCH 33.5  26.0 - 34.0 pg   MCHC  34.3  30.0 - 36.0 g/dL   RDW 16.1  09.6 - 04.5 %   Platelets 350  150 - 400 K/uL   Neutrophils Relative % 74  43 - 77 %   Neutro Abs 12.0 (*) 1.7 - 7.7 K/uL   Lymphocytes Relative 18  12 - 46 %   Lymphs Abs 2.9  0.7 - 4.0 K/uL   Monocytes Relative 7  3 - 12 %   Monocytes Absolute 1.0  0.1 - 1.0 K/uL   Eosinophils Relative 1  0 - 5 %   Eosinophils Absolute 0.1  0.0 - 0.7 K/uL    Basophils Relative 0  0 - 1 %   Basophils Absolute 0.0  0.0 - 0.1 K/uL  URINALYSIS, ROUTINE W REFLEX MICROSCOPIC     Status: None   Collection Time    11/17/13  3:45 AM      Result Value Range   Color, Urine YELLOW  YELLOW   APPearance CLEAR  CLEAR   Specific Gravity, Urine 1.025  1.005 - 1.030   pH 6.0  5.0 - 8.0   Glucose, UA NEGATIVE  NEGATIVE mg/dL   Hgb urine dipstick NEGATIVE  NEGATIVE   Bilirubin Urine NEGATIVE  NEGATIVE   Ketones, ur NEGATIVE  NEGATIVE mg/dL   Protein, ur NEGATIVE  NEGATIVE mg/dL   Urobilinogen, UA 0.2  0.0 - 1.0 mg/dL   Nitrite NEGATIVE  NEGATIVE   Leukocytes, UA NEGATIVE  NEGATIVE  CG4 I-STAT (LACTIC ACID)     Status: None   Collection Time    11/17/13  6:47 AM      Result Value Range   Lactic Acid, Venous 1.66  0.5 - 2.2 mmol/L    Imaging Studies: Ct Abdomen Pelvis W Contrast  11/17/2013   CLINICAL DATA:  Bowel obstruction.  Known hernia.  EXAM: CT ABDOMEN AND PELVIS WITH CONTRAST  TECHNIQUE: Multidetector CT imaging of the abdomen and pelvis was performed using the standard protocol following bolus administration of intravenous contrast.  CONTRAST:  OMNIPAQUE IOHEXOL 300 MG/ML  SOLN  COMPARISON:  01/20/2013  FINDINGS: BODY WALL: Unremarkable.  LOWER CHEST: Unremarkable.  ABDOMEN/PELVIS:  Liver: There is suggestion of diffuse fatty infiltration of the liver. There is a stable, benign central right lobe low-density lesion measuring 12 mm. Another, smaller right lobe, subcapsular low-density is also stable. Tiny area wispy density around the lateral right lobe of the liver, not seen previously. Finding is questionable clinical significance given no other similar peritoneal findings.  Biliary: No evidence of biliary obstruction or stone.  Pancreas: Unremarkable.  Spleen: Unremarkable.  Adrenals: Unremarkable.  Kidneys and ureters: No hydronephrosis or stone.  Bladder: Unremarkable.  Reproductive: Unremarkable.  Bowel: The mid small bowel it is distended with  fluid levels, but not dilated, measuring no more than 3 cm. There is some interloop fluid around the small bowel. No evidence of bowel necrosis. Normal appendix.  Retroperitoneum: No mass or adenopathy.  Peritoneum: No free fluid or gas.  Vascular: Abdominal aortic aneurysm status post repair. The repaired aorta continues to measure up to of 4.4 cm. 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  IMPRESSION: 1. Mildly distended small bowel with small volume interloop ascites. Findings could be from enteritis or low grade/intermittent bowel obstruction (No transition point currently). 2. Abdominal aortic aneurysm status post repair. 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   Electronically  Signed   By: Tiburcio Pea M.D.   On: 11/17/2013 06:04   Dg Abd Acute W/chest  11/17/2013   CLINICAL DATA:  Abdominal pain and nausea.  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 No cardiomegaly. Stable appearance of upper mediastinal contours. Nipple shadow noted at the left base, also seen previously. No evidence of pneumonia or effusion.  Dilated loops of small bowel in the central abdomen, measuring up to 4 cm. The colon is relatively decompressed. No evidence of pneumatosis. No evidence of renal stone.  IMPRESSION: 1. Findings suggest small bowel obstruction. No evidence of perforation. 2. 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   Electronically Signed   By: Tiburcio Pea M.D.   On: 11/17/2013 05:50   6:55 AM Dr. Heron Nay, hospitalist at [facility] has accepted patient for transfer.    Hanley Seamen, MD 11/17/13 [id number]

## 2013-11-17 NOTE — H&P (Signed)
[facility] History and Physical  Robert Wilson ZOX:[id number] DOB: 1946/08/31 DOA: 11/17/2013  Referring physician: Dr. Gwendolyn Grant PCP: Pcp Not In System   Chief Complaint: Abdominal discomfort  HPI: Robert Wilson is a 68 y.o. male  With past medical history as listed below. Who presented to the ED complaining of 2 days of abdominal discomfort. Patient states that the abdominal discomfort is mainly in his upper Augmentin described his goal. On a 1-10 pain scale 10 being the highest score, patient states the pain is a 6. Patient is currently hungry and would like to try to eat. He denies any bright red blood per rectum or any state contacts.  Nothing the patient is aware of makes the pain better or worse the problem has been persistent and getting worse since onset 2 days ago.   Review of Systems:  Constitutional:  No weight loss, night sweats, Fevers, chills, fatigue.  HEENT:  No headaches, Difficulty swallowing,Tooth/dental problems,Sore throat,  No sneezing, itching, ear ache, nasal congestion, post nasal drip,  Cardio-vascular:  No chest pain, Orthopnea, PND, swelling in lower extremities, anasarca, dizziness, palpitations  GI:  No heartburn, indigestion, + abdominal pain, nausea, vomiting, diarrhea, change in bowel habits, loss of appetite  Resp:  No shortness of breath with exertion or at rest. No excess mucus, no productive cough, No non-productive cough, No coughing up of blood.No change in color of mucus.No wheezing.No chest wall deformity  Skin:  no rash or lesions.  GU:  no dysuria, change in color of urine, no urgency or frequency. No flank pain.  Musculoskeletal:  No joint pain or swelling. No decreased range of motion. No back pain.  Psych:  No change in mood or affect. No depression or anxiety. No memory loss.   Past Medical History  Diagnosis Date  . Hyperlipidemia   . Hypertension   . Dementia    Past Surgical History  Procedure Laterality  Date  . Abdominal aortic aneurysm repair    . Knee surgery      R knee  . Vasectomy    . Colonoscopy w/ polypectomy     Social History:  reports that he has been smoking Cigarettes.  He has been smoking about 2.00 packs per day. He has never used smokeless tobacco. He reports that he does not drink alcohol or use illicit drugs.  Allergies  Allergen Reactions  . Penicillins Hives    No family history on file.   Prior to Admission medications   Medication Sig Start Date End Date Taking? Authorizing Provider  aspirin 81 MG tablet Take 81 mg by mouth daily.   Yes Historical Provider, MD  bisoprolol-hydrochlorothiazide (ZIAC) 5-6.25 MG per tablet Take 1 tablet by mouth daily.   Yes Historical Provider, MD  buPROPion (WELLBUTRIN SR) 150 MG 12 hr tablet Take 150 mg by mouth daily.   Yes Historical Provider, MD  clonazePAM (KLONOPIN) 0.5 MG tablet Take 1 mg by mouth 3 (three) times daily as needed for anxiety.    Yes Historical Provider, MD  docusate sodium (COLACE) 50 MG capsule Take by mouth 2 (two) times daily.   Yes Historical Provider, MD  fish oil-omega-3 fatty acids 1000 MG capsule Take 2 g by mouth daily.   Yes Historical Provider, MD  glucosamine-chondroitin 500-400 MG tablet Take 1 tablet by mouth 3 (three) times daily.   Yes Historical Provider, MD  Memantine HCl ER (NAMENDA XR) 28 MG CP24 Take 28 mg by mouth daily.   Yes Historical Provider,  MD  Multiple Vitamin (MULTIVITAMIN WITH MINERALS) TABS tablet Take 1 tablet by mouth daily.   Yes Historical Provider, MD  polyethylene glycol (MIRALAX / GLYCOLAX) packet Take 17 g by mouth daily.   Yes Historical Provider, MD  pravastatin (PRAVACHOL) 20 MG tablet Take 20 mg by mouth daily.   Yes Historical Provider, MD  ranitidine (ZANTAC) 150 MG tablet Take 150 mg by mouth 2 (two) times daily.   Yes Historical Provider, MD   Physical Exam: Filed Vitals:   11/17/13 1020  BP: 122/76  Pulse: 68  Temp: 98.3 F (36.8 C)  Resp: 18    BP  122/76  Pulse 68  Temp(Src) 98.3 F (36.8 C) (Oral)  Resp 18  Ht 6\' 4"  (1.93 m)  Wt 135.172 kg (298 lb)  BMI 36.29 kg/m2  SpO2 93%  General:  Appears calm and comfortable Eyes: PERRL, normal lids, irises & conjunctiva ENT: grossly normal hearing, lips & tongue Neck: no LAD, masses or thyromegaly Cardiovascular: RRR, no m/r/g. No LE edema. Telemetry: SR, no arrhythmias  Respiratory: CTA bilaterally, no w/r/r. Normal respiratory effort. Abdomen: soft, obese, + Bowel sounds, no rebound tenderness Skin: no rash or induration seen on limited exam Musculoskeletal: grossly normal tone BUE/BLE Psychiatric: grossly normal mood and affect, speech fluent and appropriate Neurologic: grossly non-focal.          Labs on Admission:  Basic Metabolic Panel:  Recent Labs Lab 11/17/13 0330  NA 141  K 3.7  CL 103  CO2 22  GLUCOSE 148*  BUN 26*  CREATININE 1.30  CALCIUM 10.0   Liver Function Tests:  Recent Labs Lab 11/17/13 0330  AST 23  ALT 26  ALKPHOS 64  BILITOT 0.3  PROT 8.2  ALBUMIN 4.4    Recent Labs Lab 11/17/13 0330  LIPASE 46   No results found for this basename: AMMONIA,  in the last 168 hours CBC:  Recent Labs Lab 11/17/13 0330  WBC 16.1*  NEUTROABS 12.0*  HGB 17.0  HCT 49.6  MCV 97.8  PLT 350   Cardiac Enzymes: No results found for this basename: CKTOTAL, CKMB, CKMBINDEX, TROPONINI,  in the last 168 hours  BNP (last 3 results) No results found for this basename: PROBNP,  in the last 8760 hours CBG: No results found for this basename: GLUCAP,  in the last 168 hours  Radiological Exams on Admission: Ct Abdomen Pelvis W Contrast  11/17/2013   CLINICAL DATA:  Bowel obstruction.  Known hernia.  EXAM: CT ABDOMEN AND PELVIS WITH CONTRAST  TECHNIQUE: Multidetector CT imaging of the abdomen and pelvis was performed using the standard protocol following bolus administration of intravenous contrast.  CONTRAST:  OMNIPAQUE IOHEXOL 300 MG/ML  SOLN   COMPARISON:  01/20/2013  FINDINGS: BODY WALL: Unremarkable.  LOWER CHEST: Unremarkable.  ABDOMEN/PELVIS:  Liver: There is suggestion of diffuse fatty infiltration of the liver. There is a stable, benign central right lobe low-density lesion measuring 12 mm. Another, smaller right lobe, subcapsular low-density is also stable. Tiny area wispy density around the lateral right lobe of the liver, not seen previously. Finding is questionable clinical significance given no other similar peritoneal findings.  Biliary: No evidence of biliary obstruction or stone.  Pancreas: Unremarkable.  Spleen: Unremarkable.  Adrenals: Unremarkable.  Kidneys and ureters: No hydronephrosis or stone.  Bladder: Unremarkable.  Reproductive: Unremarkable.  Bowel: The mid small bowel it is distended with fluid levels, but not dilated, measuring no more than 3 cm. There is some interloop fluid around  the small bowel. No evidence of bowel necrosis. Normal appendix.  Retroperitoneum: No mass or adenopathy.  Peritoneum: No free fluid or gas.  Vascular: Abdominal aortic aneurysm status post repair. The repaired aorta continues to measure up to of 4.4 cm. 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  IMPRESSION: 1. Mildly distended small bowel with small volume interloop ascites. Findings could be from enteritis or low grade/intermittent bowel obstruction (No transition point currently). 2. Abdominal aortic aneurysm status post repair. 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   Electronically Signed   By: Tiburcio PeaJonathan  Watts M.D.   On: 11/17/2013 06:04   Dg Abd Acute W/chest  11/17/2013   CLINICAL DATA:  Abdominal pain and nausea.  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  No cardiomegaly. Stable appearance of upper mediastinal contours. Nipple shadow noted at the left base, also seen previously. No evidence of pneumonia or effusion.  Dilated loops of small bowel in the central abdomen, measuring up to 4 cm. The colon is relatively decompressed. No evidence of pneumatosis. No evidence of renal stone.  IMPRESSION: 1. Findings suggest small bowel obstruction. No evidence of perforation. 2. 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   Electronically Signed   By: Tiburcio PeaJonathan  Watts M.D.   On: 11/17/2013 05:50    Assessment/Plan Active Problems:   Enteritis (principal problem) - At this point supportive therapy and IV antibiotics Cipro and Flagyl - Given reports of possible SBO will also recommend ambulation and have discussed with patient and nurse. Of note patient denies any nausea or vomiting and currently does not fit and SBO type of picture - Will reassess white blood cell levels next a.m.  HTN - Stable on home regimen we'll plan on continuing all in house.  Dementia - Stable, no behavioral disturbances   Code Status:full code Family Communication: No family at bedside Disposition Plan:  If condition improved and lower white blood cell count next a.m. we'll plan on discharging on Cipro and Flagyl  Time spent: More than 50 minutes  Penny PiaVEGA, Rasha Ibe [facility] Pager [phone]

## 2013-11-18 LAB — CBC
HCT: 43.8 % (ref 39.0–52.0)
HEMOGLOBIN: 14.6 g/dL (ref 13.0–17.0)
MCH: 33.1 pg (ref 26.0–34.0)
MCHC: 33.3 g/dL (ref 30.0–36.0)
MCV: 99.3 fL (ref 78.0–100.0)
Platelets: 333 10*3/uL (ref 150–400)
RBC: 4.41 MIL/uL (ref 4.22–5.81)
RDW: 13.5 % (ref 11.5–15.5)
WBC: 11.3 10*3/uL — ABNORMAL HIGH (ref 4.0–10.5)

## 2013-11-18 LAB — BASIC METABOLIC PANEL
BUN: 20 mg/dL (ref 6–23)
CO2: 22 mEq/L (ref 19–32)
Calcium: 8.8 mg/dL (ref 8.4–10.5)
Chloride: 105 mEq/L (ref 96–112)
Creatinine, Ser: 1.14 mg/dL (ref 0.50–1.35)
GFR calc Af Amer: 76 mL/min — ABNORMAL LOW (ref >90)
GFR calc non Af Amer: 66 mL/min — ABNORMAL LOW (ref >90)
GLUCOSE: 114 mg/dL — AB (ref 70–99)
POTASSIUM: 3.6 meq/L — AB (ref 3.7–5.3)
Sodium: 138 mEq/L (ref 137–147)

## 2013-11-18 MED ORDER — METRONIDAZOLE 500 MG PO TABS: 500.0000 mg | ORAL_TABLET | Freq: Three times a day (TID) | ORAL | Status: DC

## 2013-11-18 MED ORDER — CIPROFLOXACIN HCL 500 MG PO TABS: 500.0000 mg | ORAL_TABLET | Freq: Two times a day (BID) | ORAL | Status: DC

## 2013-11-18 NOTE — Progress Notes (Signed)
UR completed. Patient changed to inpatient r/t requiring IVF @ 75cc/hr and IV antibiotics.  

## 2013-11-18 NOTE — Discharge Summary (Signed)
Physician Discharge Summary  Kemper Heupel JXB:[id number] DOB: 1946/09/15 DOA: 11/17/2013  PCP: Pcp Not In System  Admit date: 11/17/2013 Discharge date: 11/18/2013  Time spent: > 35 minutes  Recommendations for Outpatient Follow-up:  1. Reassess potassium levels 2. Decide whether or not patient will require a prolonged antibiotic course for enteritis (my impression is that he wont)   Discharge Diagnoses:  Principal Problem:   Enteritis Active Problems:   Enterocolitis   Dementia with behavioral disturbance   HTN (hypertension)   Discharge Condition: stable  Diet recommendation: heart healthy  Filed Weights   11/17/13 0317  Weight: 135.172 kg (298 lb)    History of present illness:  Patient is a 68 y/o with h/o HPL, Dementia, and HTN. He presented to the hospital complaining of abdominal discomfort. CT scan in the ED reported partial sbo vs enteritis.  Patient improved on antibiotics.  Hospital Course:   Enteritis (principal problem)  - WBC levels improved on IV antibiotics cipro and flagyl - Patient has no complaints currently and condition improved on antibiotics.  Will provide a shorter course of antibiotics for presumed enteritis. - patient denies any N/V, passing flatus, and tolerating diet on day of d/c  HTN  - Stable continue home regimen  Dementia  - Stable, no behavioral disturbances  Procedures:  None  Consultations:  None  Discharge Exam: Filed Vitals:   11/18/13 0435  BP: 118/75  Pulse: 56  Temp: 98.1 F (36.7 C)  Resp: 18    General: Pt in NAD, alert and awake Cardiovascular: RRR, no MRG Respiratory: CTA BL, no wheezes  Discharge Instructions  Discharge Orders   Future Orders Complete By Expires   Call MD for:  redness, tenderness, or signs of infection (pain, swelling, redness, odor or green/yellow discharge around incision site)  As directed    Call MD for:  severe uncontrolled pain  As directed    Call MD for:  temperature  >100.4  As directed    Diet - low sodium heart healthy  As directed    Discharge instructions  As directed    Comments:     Pt to f/u with pcp in 1-2 weeks or sooner should any new concerns arise.   Increase activity slowly  As directed        Medication List         aspirin 81 MG tablet  Take 81 mg by mouth daily.     bisoprolol-hydrochlorothiazide 5-6.25 MG per tablet  Commonly known as:  ZIAC  Take 1 tablet by mouth daily.     buPROPion 150 MG 12 hr tablet  Commonly known as:  WELLBUTRIN SR  Take 150 mg by mouth daily.     ciprofloxacin 500 MG tablet  Commonly known as:  CIPRO  Take 1 tablet (500 mg total) by mouth 2 (two) times daily.     clonazePAM 0.5 MG tablet  Commonly known as:  KLONOPIN  Take 1 mg by mouth 3 (three) times daily as needed for anxiety.     docusate sodium 50 MG capsule  Commonly known as:  COLACE  Take by mouth 2 (two) times daily.     fish oil-omega-3 fatty acids 1000 MG capsule  Take 2 g by mouth daily.     glucosamine-chondroitin 500-400 MG tablet  Take 1 tablet by mouth 3 (three) times daily.     metroNIDAZOLE 500 MG tablet  Commonly known as:  FLAGYL  Take 1 tablet (500 mg total) by mouth  3 (three) times daily.     multivitamin with minerals Tabs tablet  Take 1 tablet by mouth daily.     NAMENDA XR 28 MG Cp24  Generic drug:  Memantine HCl ER  Take 28 mg by mouth daily.     polyethylene glycol packet  Commonly known as:  MIRALAX / GLYCOLAX  Take 17 g by mouth daily.     pravastatin 20 MG tablet  Commonly known as:  PRAVACHOL  Take 20 mg by mouth daily.     ranitidine 150 MG tablet  Commonly known as:  ZANTAC  Take 150 mg by mouth 2 (two) times daily.       Allergies  Allergen Reactions  . Penicillins Hives      The results of significant diagnostics from this hospitalization (including imaging, microbiology, ancillary and laboratory) are listed below for reference.    Significant Diagnostic Studies: Ct Abdomen  Pelvis W Contrast  11/17/2013   CLINICAL DATA:  Bowel obstruction.  Known hernia.  EXAM: CT ABDOMEN AND PELVIS WITH CONTRAST  TECHNIQUE: Multidetector CT imaging of the abdomen and pelvis was performed using the standard protocol following bolus administration of intravenous contrast.  CONTRAST:  100mL OMNIPAQUE IOHEXOL 300 MG/ML  SOLN  COMPARISON:  01/20/2013  FINDINGS: BODY WALL: Unremarkable.  LOWER CHEST: Unremarkable.  ABDOMEN/PELVIS:  Liver: There is suggestion of diffuse fatty infiltration of the liver. There is a stable, benign central right lobe low-density lesion measuring 12 mm. Another, smaller right lobe, subcapsular low-density is also stable. Tiny area wispy density around the lateral right lobe of the liver, not seen previously. Finding is questionable clinical significance given no other similar peritoneal findings.  Biliary: No evidence of biliary obstruction or stone.  Pancreas: Unremarkable.  Spleen: Unremarkable.  Adrenals: Unremarkable.  Kidneys and ureters: No hydronephrosis or stone.  Bladder: Unremarkable.  Reproductive: Unremarkable.  Bowel: The mid small bowel it is distended with fluid levels, but not dilated, measuring no more than 3 cm. There is some interloop fluid around the small bowel. No evidence of bowel necrosis. Normal appendix.  Retroperitoneum: No mass or adenopathy.  Peritoneum: No free fluid or gas.  Vascular: Abdominal aortic aneurysm status post repair. The repaired aorta continues to measure up to of 4.4 cm. 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  IMPRESSION: 1. Mildly distended small bowel with small volume interloop ascites. Findings could be from enteritis or low grade/intermittent bowel obstruction (No transition point currently). 2. Abdominal aortic aneurysm status post repair. Unchanged left common iliac artery aneurysm at 2.8 cm  diameter.   Electronically Signed   By: Tiburcio PeaJonathan  Watts M.D.   On: 11/17/2013 06:04   Dg Abd Acute W/chest  11/17/2013   CLINICAL DATA:  Abdominal pain and nausea.  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 No cardiomegaly. Stable appearance of upper mediastinal contours. Nipple shadow noted at the left base, also seen previously. No evidence of pneumonia or effusion.  Dilated loops of small bowel in the central abdomen, measuring up to 4 cm. The colon is relatively decompressed. No evidence of pneumatosis. No evidence of renal stone.  IMPRESSION: 1. Findings suggest small bowel obstruction. No evidence of perforation. 2. 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   Electronically Signed  By: Tiburcio Pea M.D.   On: 11/17/2013 05:50    Microbiology: No results found for this or any previous visit (from the past 240 hour(s)).   Labs: Basic Metabolic Panel:  Recent Labs Lab 11/17/13 0330 11/17/13 1252 11/18/13 0446  NA 141  --  138  K 3.7  --  3.6*  CL 103  --  105  CO2 22  --  22  GLUCOSE 148*  --  114*  BUN 26*  --  20  CREATININE 1.30 1.25 1.14  CALCIUM 10.0  --  8.8   Liver Function Tests:  Recent Labs Lab 11/17/13 0330  AST 23  ALT 26  ALKPHOS 64  BILITOT 0.3  PROT 8.2  ALBUMIN 4.4    Recent Labs Lab 11/17/13 0330  LIPASE 46   No results found for this basename: AMMONIA,  in the last 168 hours CBC:  Recent Labs Lab 11/17/13 0330 11/17/13 1252 11/18/13 0446  WBC 16.1* 14.5* 11.3*  NEUTROABS 12.0*  --   --   HGB 17.0 15.5 14.6  HCT 49.6 45.7 43.8  MCV 97.8 99.8 99.3  PLT 350 333 333   Cardiac Enzymes: No results found for this basename: CKTOTAL, CKMB, CKMBINDEX, TROPONINI,  in the last 168 hours BNP: BNP (last 3 results) No results found for this basename: PROBNP,  in the last 8760 hours CBG: No  results found for this basename: GLUCAP,  in the last 168 hours     Signed:  Penny Pia  [facility] 11/18/2013, 12:36 PM

## 2016-11-19 ENCOUNTER — Inpatient Hospital Stay (HOSPITAL_COMMUNITY)
Admission: EM | Admit: 2016-11-19 | Discharge: 2016-11-23 | DRG: 871 | Disposition: A | Discharge status: Skilled Nursing Facility | Payer: Medicare HMO | Attending: Internal Medicine | Admitting: Internal Medicine

## 2016-11-19 ENCOUNTER — Emergency Department (HOSPITAL_COMMUNITY): Payer: Medicare HMO

## 2016-11-19 ENCOUNTER — Encounter (HOSPITAL_COMMUNITY): Payer: Self-pay

## 2016-11-19 DIAGNOSIS — Z880 Allergy status to penicillin: Secondary | ICD-10-CM

## 2016-11-19 DIAGNOSIS — E876 Hypokalemia: Secondary | ICD-10-CM | POA: Diagnosis present

## 2016-11-19 DIAGNOSIS — R6510 Systemic inflammatory response syndrome (SIRS) of non-infectious origin without acute organ dysfunction: Secondary | ICD-10-CM

## 2016-11-19 DIAGNOSIS — R2689 Other abnormalities of gait and mobility: Secondary | ICD-10-CM

## 2016-11-19 DIAGNOSIS — R509 Fever, unspecified: Secondary | ICD-10-CM | POA: Diagnosis present

## 2016-11-19 DIAGNOSIS — Y95 Nosocomial condition: Secondary | ICD-10-CM | POA: Diagnosis present

## 2016-11-19 DIAGNOSIS — I959 Hypotension, unspecified: Secondary | ICD-10-CM | POA: Diagnosis present

## 2016-11-19 DIAGNOSIS — D649 Anemia, unspecified: Secondary | ICD-10-CM

## 2016-11-19 DIAGNOSIS — F0390 Unspecified dementia without behavioral disturbance: Secondary | ICD-10-CM | POA: Diagnosis present

## 2016-11-19 DIAGNOSIS — J440 Chronic obstructive pulmonary disease with acute lower respiratory infection: Secondary | ICD-10-CM | POA: Diagnosis present

## 2016-11-19 DIAGNOSIS — N179 Acute kidney failure, unspecified: Secondary | ICD-10-CM | POA: Diagnosis present

## 2016-11-19 DIAGNOSIS — F17210 Nicotine dependence, cigarettes, uncomplicated: Secondary | ICD-10-CM | POA: Diagnosis present

## 2016-11-19 DIAGNOSIS — E43 Unspecified severe protein-calorie malnutrition: Secondary | ICD-10-CM | POA: Diagnosis present

## 2016-11-19 DIAGNOSIS — A419 Sepsis, unspecified organism: Principal | ICD-10-CM | POA: Diagnosis present

## 2016-11-19 DIAGNOSIS — N181 Chronic kidney disease, stage 1: Secondary | ICD-10-CM | POA: Diagnosis present

## 2016-11-19 DIAGNOSIS — E785 Hyperlipidemia, unspecified: Secondary | ICD-10-CM | POA: Diagnosis present

## 2016-11-19 DIAGNOSIS — M6281 Muscle weakness (generalized): Secondary | ICD-10-CM

## 2016-11-19 DIAGNOSIS — I129 Hypertensive chronic kidney disease with stage 1 through stage 4 chronic kidney disease, or unspecified chronic kidney disease: Secondary | ICD-10-CM | POA: Diagnosis present

## 2016-11-19 DIAGNOSIS — Z6833 Body mass index (BMI) 33.0-33.9, adult: Secondary | ICD-10-CM

## 2016-11-19 DIAGNOSIS — J189 Pneumonia, unspecified organism: Secondary | ICD-10-CM | POA: Diagnosis present

## 2016-11-19 HISTORY — DX: Chronic obstructive pulmonary disease, unspecified: J44.9

## 2016-11-19 HISTORY — DX: Disorder of kidney and ureter, unspecified: N28.9

## 2016-11-19 LAB — BASIC METABOLIC PANEL
Anion gap: 9 (ref 5–15)
BUN: 26 mg/dL — AB (ref 6–20)
CHLORIDE: 104 mmol/L (ref 101–111)
CO2: 21 mmol/L — AB (ref 22–32)
Calcium: 8.4 mg/dL — ABNORMAL LOW (ref 8.9–10.3)
Creatinine, Ser: 1.89 mg/dL — ABNORMAL HIGH (ref 0.61–1.24)
GFR calc Af Amer: 40 mL/min — ABNORMAL LOW (ref >60)
GFR calc non Af Amer: 35 mL/min — ABNORMAL LOW (ref >60)
GLUCOSE: 132 mg/dL — AB (ref 65–99)
POTASSIUM: 3.3 mmol/L — AB (ref 3.5–5.1)
Sodium: 134 mmol/L — ABNORMAL LOW (ref 135–145)

## 2016-11-19 LAB — LACTIC ACID, PLASMA: Lactic Acid, Venous: 1.1 mmol/L (ref 0.5–1.9)

## 2016-11-19 LAB — CBC WITH DIFFERENTIAL/PLATELET
Basophils Absolute: 0 10*3/uL (ref 0.0–0.1)
Basophils Relative: 0 %
EOS PCT: 0 %
Eosinophils Absolute: 0 10*3/uL (ref 0.0–0.7)
HCT: 38.9 % — ABNORMAL LOW (ref 39.0–52.0)
HEMOGLOBIN: 12.9 g/dL — AB (ref 13.0–17.0)
LYMPHS ABS: 2.5 10*3/uL (ref 0.7–4.0)
LYMPHS PCT: 14 %
MCH: 32.5 pg (ref 26.0–34.0)
MCHC: 33.2 g/dL (ref 30.0–36.0)
MCV: 98 fL (ref 78.0–100.0)
Monocytes Absolute: 2.2 10*3/uL — ABNORMAL HIGH (ref 0.1–1.0)
Monocytes Relative: 12 %
NEUTROS PCT: 74 %
Neutro Abs: 13.6 10*3/uL — ABNORMAL HIGH (ref 1.7–7.7)
Platelets: 435 10*3/uL — ABNORMAL HIGH (ref 150–400)
RBC: 3.97 MIL/uL — AB (ref 4.22–5.81)
RDW: 13.2 % (ref 11.5–15.5)
WBC: 18.3 10*3/uL — AB (ref 4.0–10.5)

## 2016-11-19 LAB — HEPATIC FUNCTION PANEL
ALT: 40 U/L (ref 17–63)
AST: 46 U/L — ABNORMAL HIGH (ref 15–41)
Albumin: 2.9 g/dL — ABNORMAL LOW (ref 3.5–5.0)
Alkaline Phosphatase: 53 U/L (ref 38–126)
BILIRUBIN TOTAL: 0.9 mg/dL (ref 0.3–1.2)
Bilirubin, Direct: 0.3 mg/dL (ref 0.1–0.5)
Indirect Bilirubin: 0.6 mg/dL (ref 0.3–0.9)
Total Protein: 5.9 g/dL — ABNORMAL LOW (ref 6.5–8.1)

## 2016-11-19 MED ORDER — SODIUM CHLORIDE 0.9 % IV SOLN
1000.0000 mL | INTRAVENOUS | Status: DC
  Administered 2016-11-19 – 2016-11-20 (×2): 1000 mL via INTRAVENOUS

## 2016-11-19 MED ORDER — ACETAMINOPHEN 500 MG PO TABS
ORAL_TABLET | ORAL | Status: AC
  Filled 2016-11-19: qty 2

## 2016-11-19 MED ORDER — SODIUM CHLORIDE 0.9 % IV BOLUS (SEPSIS)
2000.0000 mL | Freq: Once | INTRAVENOUS | Status: AC
  Administered 2016-11-19: 2000 mL via INTRAVENOUS

## 2016-11-19 MED ORDER — AZTREONAM 2 G IJ SOLR
2.0000 g | Freq: Once | INTRAMUSCULAR | Status: AC
  Administered 2016-11-19: 2 g via INTRAVENOUS
  Filled 2016-11-19: qty 2

## 2016-11-19 MED ORDER — VANCOMYCIN HCL IN DEXTROSE 1-5 GM/200ML-% IV SOLN
1000.0000 mg | Freq: Once | INTRAVENOUS | Status: AC
  Administered 2016-11-20: 1000 mg via INTRAVENOUS
  Filled 2016-11-19: qty 200

## 2016-11-19 MED ORDER — ACETAMINOPHEN 500 MG PO TABS
1000.0000 mg | ORAL_TABLET | Freq: Once | ORAL | Status: AC
  Administered 2016-11-19: 1000 mg via ORAL

## 2016-11-19 MED ORDER — LEVOFLOXACIN IN D5W 750 MG/150ML IV SOLN
750.0000 mg | Freq: Once | INTRAVENOUS | Status: AC
  Administered 2016-11-19: 750 mg via INTRAVENOUS
  Filled 2016-11-19: qty 150

## 2016-11-19 NOTE — ED Triage Notes (Signed)
Reported pt with 103 fever at [facility], no tylenol given.  Hypotensive at [facility] 70/50 but [facility] got 110/72, cbg 171 and temp of 101.  On arrival, pt c/o thirst and denies pain

## 2016-11-19 NOTE — ED Triage Notes (Signed)
Per report from [facility] nurse, pt developed a fever today up to 103.5 oral.  Pt has been chilled this evening and also diaphoretic with increased thirst.    Pt is a new resident x 1 week for new onset dementia.   Pt arrives awake and alert.

## 2016-11-19 NOTE — ED Provider Notes (Signed)
By signing my name below, I, Clarisse Gouge, attest that this documentation has been prepared under the direction and in the presence of Kristen N Ward, DO. Electronically signed, Clarisse Gouge, ED Scribe. 11/20/16. 12:13 AM.   TIME SEEN: 11:17 PM 11/19/2016  CHIEF COMPLAINT: Fever  HPI: Robert Wilson is a 71 y.o. male with history of hypertension, hyperlipidemia, dementia, COPD BIB EMS from [facility] who presents to the Emergency Department for a fever. Triage notes pt has been a resident at [facility] x 1 week for new onset dementia and had a temperature of 103.5 PTA. Triage states pt reported diaphoresis, chills and increased thirst this evening. Nurse notes current temperature of 100.2.  Pt denies pain, cough and N/V/D.  Denies headache, chest pain, abdominal pain.  Hypotensive with EMS but this has improved.     ROS: LEVEL 5 CAVEAT FOR DEMENTIA.  PAST MEDICAL HISTORY/PAST SURGICAL HISTORY:  Past Medical History:  Diagnosis Date  . COPD (chronic obstructive pulmonary disease) (HCC)   . Dementia   . Hyperlipidemia   . Hypertension   . Renal disorder     MEDICATIONS:  Prior to Admission medications   Medication Sig Start Date End Date Taking? Authorizing Provider  bisacodyl (DULCOLAX) 5 MG EC tablet Take 10 mg by mouth. 11/03/16 12/03/16 Yes Historical Provider, MD  QUEtiapine (SEROQUEL) 25 MG tablet Take 25 mg by mouth 3 (three) times daily.  11/03/16  Yes Historical Provider, MD  aspirin 81 MG tablet Take 81 mg by mouth daily.    Historical Provider, MD  fish oil-omega-3 fatty acids 1000 MG capsule Take 2 g by mouth daily.    Historical Provider, MD  glucosamine-chondroitin 500-400 MG tablet Take 1 tablet by mouth 3 (three) times daily.    Historical Provider, MD  memantine (NAMENDA) 10 MG tablet  11/01/16   Historical Provider, MD  perphenazine (TRILAFON) 4 MG tablet  10/17/16   Historical Provider, MD  polyethylene glycol (MIRALAX / GLYCOLAX) packet Take 17 g by mouth daily.     Historical Provider, MD  pravastatin (PRAVACHOL) 20 MG tablet Take 20 mg by mouth daily.    Historical Provider, MD  ranitidine (ZANTAC) 300 MG tablet  09/23/16   Historical Provider, MD    ALLERGIES:  Allergies  Allergen Reactions  . Penicillins Hives    SOCIAL HISTORY:  Social History  Substance Use Topics  . Smoking status: Current Every Day Smoker    Packs/day: 2.00    Types: Cigarettes  . Smokeless tobacco: Never Used  . Alcohol use No    FAMILY HISTORY: History reviewed. No pertinent family history.  EXAM: BP 106/71 (BP Location: Left Arm)   Pulse 101   Temp (!) 104.2 F (40.1 C) (Rectal)   Resp 26   SpO2 95%  CONSTITUTIONAL: Alert and oriented to person only and responds appropriately to questions. Chronically ill-appearing, in no distress, febrile but nontoxic HEAD: Normocephalic EYES: Conjunctivae clear, PERRL, EOMI ENT: normal nose; no rhinorrhea; moist mucous membranes NECK: Supple, no meningismus, no nuchal rigidity, no LAD  CARD: Regular and tachycardic; S1 and S2 appreciated; no murmurs, no clicks, no rubs, no gallops RESP: Normal chest excursion without splinting or tachypnea; breath sounds clear and equal bilaterally; no wheezes, no rhonchi, no rales, no hypoxia or respiratory distress, speaking full sentences ABD/GI: Normal bowel sounds; non-distended; soft, non-tender, no rebound, no guarding, no peritoneal signs, no hepatosplenomegaly BACK:  The back appears normal and is non-tender to palpation, there is no CVA tenderness EXT: Normal  ROM in all joints; non-tender to palpation; no edema; normal capillary refill; no cyanosis, no calf tenderness or swelling    SKIN: Normal color for age and race; warm; no rash, diaphoretic NEURO: Moves all extremities equally, sensation to light touch intact diffusely, cranial nerves II through XII intact, normal speech PSYCH: The patient's mood and manner are appropriate. Grooming and personal hygiene are  appropriate.  MEDICAL DECISION MAKING: Patient here as a code sepsis. Febrile, tachycardic initially hypotensive but this has improved. Receiving IV fluids currently. Will give broad-spectrum antibiotics. We'll obtain labs, cultures, urine, chest x-ray and flu swab.  ED PROGRESS: 1:45 AM  Labs show leukocytosis with left shift. Mild renal failure. Flu swab negative. Chest x-ray clear. Urine pending. Discussed with hospitalist Dr. Selena BattenKim for admission to step down, inpatient. Patient hemodynamically stable and comfortable. I will place holding orders per hospitalist request.    Sepsis - Repeat Assessment  Performed at:    1:48 AM  Vitals     Blood pressure 109/75, pulse 78, temperature 100.9 F (38.3 C), temperature source Oral, resp. rate 20, height 6\' 4"  (1.93 m), weight 298 lb (135.2 kg), SpO2 97 %.  Heart:     Tachycardic  Lungs:    CTA  Capillary Refill:   <2 sec  Peripheral Pulse:   Radial pulse palpable  Skin:     Normal Color     EKG Interpretation  Date/Time:  Saturday November 19 2016 22:24:16 EST Ventricular Rate:  100 PR Interval:    QRS Duration: 104 QT Interval:  361 QTC Calculation: 466 R Axis:   57 Text Interpretation:  Sinus tachycardia No significant change since last tracing other than rate is faster Confirmed by WARD,  DO, KRISTEN ([id number]) on 11/19/2016 11:54:57 PM        CRITICAL CARE Performed by: Raelyn NumberWARD, KRISTEN N   Total critical care time: 45 minutes  Critical care time was exclusive of separately billable procedures and treating other patients.  Critical care was necessary to treat or prevent imminent or life-threatening deterioration.  Critical care was time spent personally by me on the following activities: development of treatment plan with patient and/or surrogate as well as nursing, discussions with consultants, evaluation of patient's response to treatment, examination of patient, obtaining history from patient or surrogate, ordering and  performing treatments and interventions, ordering and review of laboratory studies, ordering and review of radiographic studies, pulse oximetry and re-evaluation of patient's condition.   I personally performed the services described in this documentation, which was scribed in my presence. The recorded information has been reviewed and is accurate.    Layla MawKristen N Ward, DO 11/20/16 [phone]

## 2016-11-19 NOTE — ED Notes (Signed)
Rectal temp 100.9

## 2016-11-20 ENCOUNTER — Inpatient Hospital Stay (HOSPITAL_COMMUNITY): Payer: Medicare HMO

## 2016-11-20 ENCOUNTER — Encounter (HOSPITAL_COMMUNITY): Payer: Self-pay | Admitting: Internal Medicine

## 2016-11-20 DIAGNOSIS — D649 Anemia, unspecified: Secondary | ICD-10-CM | POA: Diagnosis present

## 2016-11-20 DIAGNOSIS — A419 Sepsis, unspecified organism: Principal | ICD-10-CM

## 2016-11-20 DIAGNOSIS — Y95 Nosocomial condition: Secondary | ICD-10-CM | POA: Diagnosis present

## 2016-11-20 DIAGNOSIS — R6510 Systemic inflammatory response syndrome (SIRS) of non-infectious origin without acute organ dysfunction: Secondary | ICD-10-CM | POA: Diagnosis present

## 2016-11-20 DIAGNOSIS — I129 Hypertensive chronic kidney disease with stage 1 through stage 4 chronic kidney disease, or unspecified chronic kidney disease: Secondary | ICD-10-CM | POA: Diagnosis present

## 2016-11-20 DIAGNOSIS — R509 Fever, unspecified: Secondary | ICD-10-CM

## 2016-11-20 DIAGNOSIS — J189 Pneumonia, unspecified organism: Secondary | ICD-10-CM | POA: Diagnosis present

## 2016-11-20 DIAGNOSIS — E876 Hypokalemia: Secondary | ICD-10-CM | POA: Diagnosis present

## 2016-11-20 DIAGNOSIS — N181 Chronic kidney disease, stage 1: Secondary | ICD-10-CM | POA: Diagnosis present

## 2016-11-20 DIAGNOSIS — Z880 Allergy status to penicillin: Secondary | ICD-10-CM | POA: Diagnosis not present

## 2016-11-20 DIAGNOSIS — E785 Hyperlipidemia, unspecified: Secondary | ICD-10-CM | POA: Diagnosis present

## 2016-11-20 DIAGNOSIS — Z6833 Body mass index (BMI) 33.0-33.9, adult: Secondary | ICD-10-CM | POA: Diagnosis not present

## 2016-11-20 DIAGNOSIS — N179 Acute kidney failure, unspecified: Secondary | ICD-10-CM | POA: Diagnosis not present

## 2016-11-20 DIAGNOSIS — F0390 Unspecified dementia without behavioral disturbance: Secondary | ICD-10-CM | POA: Diagnosis present

## 2016-11-20 DIAGNOSIS — E43 Unspecified severe protein-calorie malnutrition: Secondary | ICD-10-CM | POA: Diagnosis present

## 2016-11-20 DIAGNOSIS — I959 Hypotension, unspecified: Secondary | ICD-10-CM | POA: Diagnosis not present

## 2016-11-20 DIAGNOSIS — F17210 Nicotine dependence, cigarettes, uncomplicated: Secondary | ICD-10-CM | POA: Diagnosis present

## 2016-11-20 DIAGNOSIS — J440 Chronic obstructive pulmonary disease with acute lower respiratory infection: Secondary | ICD-10-CM | POA: Diagnosis present

## 2016-11-20 LAB — ECHOCARDIOGRAM COMPLETE
AOPV: 0.88 m/s
AV Area VTI index: 1.27 cm2/m2
AV Mean grad: 8 mmHg
AV area mean vel ind: 1.32 cm2/m2
AV vel: 3.32
AVA: 3.32 cm2
AVAREAMEANV: 3.46 cm2
AVAREAVTI: 3.64 cm2
AVCELMEANRAT: 0.83
AVLVOTPG: 12 mmHg
AVPG: 15 mmHg
AVPKVEL: 194 cm/s
CHL CUP AV PEAK INDEX: 1.39
CHL CUP AV VALUE AREA INDEX: 1.27
CHL CUP DOP CALC LVOT VTI: 32.2 cm
CHL CUP MV DEC (S): 327
CHL CUP STROKE VOLUME: 54 mL
DOP CAL AO MEAN VELOCITY: 126 cm/s
E decel time: 327 msec
EERAT: 5.47
FS: 36 % (ref 28–44)
Height: 76 in
IVS/LV PW RATIO, ED: 0.96
LA vol A4C: 49.2 ml
LADIAMINDEX: 1.45 cm/m2
LASIZE: 38 mm
LAVOL: 46.1 mL
LAVOLIN: 17.6 mL/m2
LEFT ATRIUM END SYS DIAM: 38 mm
LV E/e' medial: 5.47
LV E/e'average: 5.47
LV PW d: 13.2 mm — AB (ref 0.6–1.1)
LV SIMPSON'S DISK: 62
LV TDI E'LATERAL: 14.4
LV dias vol index: 33 mL/m2
LV dias vol: 87 mL (ref 62–150)
LV e' LATERAL: 14.4 cm/s
LV sys vol index: 13 mL/m2
LV sys vol: 33 mL (ref 21–61)
LVOT SV: 134 mL
LVOT area: 4.15 cm2
LVOT peak VTI: 0.8 cm
LVOTD: 23 mm
LVOTPV: 170 cm/s
Lateral S' vel: 22.8 cm/s
MVPG: 2 mmHg
MVPKAVEL: 76.4 m/s
MVPKEVEL: 78.8 m/s
TAPSE: 34.6 mm
TDI e' medial: 11.7
VTI: 40.3 cm
Weight: 4405.67 oz

## 2016-11-20 LAB — COMPREHENSIVE METABOLIC PANEL
ALT: 38 U/L (ref 17–63)
ANION GAP: 7 (ref 5–15)
AST: 41 U/L (ref 15–41)
Albumin: 3 g/dL — ABNORMAL LOW (ref 3.5–5.0)
Alkaline Phosphatase: 57 U/L (ref 38–126)
BILIRUBIN TOTAL: 0.9 mg/dL (ref 0.3–1.2)
BUN: 23 mg/dL — ABNORMAL HIGH (ref 6–20)
CALCIUM: 8.3 mg/dL — AB (ref 8.9–10.3)
CO2: 24 mmol/L (ref 22–32)
Chloride: 108 mmol/L (ref 101–111)
Creatinine, Ser: 1.46 mg/dL — ABNORMAL HIGH (ref 0.61–1.24)
GFR calc Af Amer: 55 mL/min — ABNORMAL LOW (ref >60)
GFR calc non Af Amer: 48 mL/min — ABNORMAL LOW (ref >60)
Glucose, Bld: 110 mg/dL — ABNORMAL HIGH (ref 65–99)
POTASSIUM: 3.7 mmol/L (ref 3.5–5.1)
Sodium: 139 mmol/L (ref 135–145)
TOTAL PROTEIN: 6.4 g/dL — AB (ref 6.5–8.1)

## 2016-11-20 LAB — URINALYSIS, MICROSCOPIC (REFLEX)

## 2016-11-20 LAB — URINALYSIS, ROUTINE W REFLEX MICROSCOPIC
Bilirubin Urine: NEGATIVE
Glucose, UA: NEGATIVE mg/dL
HGB URINE DIPSTICK: NEGATIVE
LEUKOCYTES UA: NEGATIVE
NITRITE: NEGATIVE
PROTEIN: 100 mg/dL — AB
SPECIFIC GRAVITY, URINE: 1.025 (ref 1.005–1.030)
pH: 5.5 (ref 5.0–8.0)

## 2016-11-20 LAB — INFLUENZA PANEL BY PCR (TYPE A & B)
INFLAPCR: NEGATIVE
INFLBPCR: NEGATIVE

## 2016-11-20 LAB — MRSA PCR SCREENING: MRSA BY PCR: POSITIVE — AB

## 2016-11-20 LAB — CREATININE, SERUM
CREATININE: 1.46 mg/dL — AB (ref 0.61–1.24)
GFR calc Af Amer: 55 mL/min — ABNORMAL LOW (ref >60)
GFR, EST NON AFRICAN AMERICAN: 48 mL/min — AB (ref >60)

## 2016-11-20 LAB — CBC
HEMATOCRIT: 39.1 % (ref 39.0–52.0)
HEMOGLOBIN: 12.9 g/dL — AB (ref 13.0–17.0)
MCH: 32.6 pg (ref 26.0–34.0)
MCHC: 33 g/dL (ref 30.0–36.0)
MCV: 98.7 fL (ref 78.0–100.0)
Platelets: 445 10*3/uL — ABNORMAL HIGH (ref 150–400)
RBC: 3.96 MIL/uL — ABNORMAL LOW (ref 4.22–5.81)
RDW: 13.4 % (ref 11.5–15.5)
WBC: 21.8 10*3/uL — ABNORMAL HIGH (ref 4.0–10.5)

## 2016-11-20 LAB — TROPONIN I
TROPONIN I: 0.03 ng/mL — AB (ref <0.03)
Troponin I: <0.03 ng/mL (ref <0.03)

## 2016-11-20 LAB — LACTIC ACID, PLASMA: Lactic Acid, Venous: 1.1 mmol/L (ref 0.5–1.9)

## 2016-11-20 LAB — C DIFFICILE QUICK SCREEN W PCR REFLEX
C DIFFICLE (CDIFF) ANTIGEN: NEGATIVE
C Diff interpretation: NOT DETECTED
C Diff toxin: NEGATIVE

## 2016-11-20 LAB — CORTISOL: CORTISOL PLASMA: 18.6 ug/dL

## 2016-11-20 MED ORDER — METRONIDAZOLE 500 MG PO TABS
500.0000 mg | ORAL_TABLET | Freq: Three times a day (TID) | ORAL | Status: DC
  Administered 2016-11-20: 500 mg via ORAL
  Filled 2016-11-20: qty 1

## 2016-11-20 MED ORDER — ASPIRIN EC 81 MG PO TBEC
81.0000 mg | DELAYED_RELEASE_TABLET | Freq: Every day | ORAL | Status: DC
  Administered 2016-11-20 – 2016-11-23 (×4): 81 mg via ORAL
  Filled 2016-11-20 (×4): qty 1

## 2016-11-20 MED ORDER — ACETAMINOPHEN 325 MG PO TABS
650.0000 mg | ORAL_TABLET | Freq: Four times a day (QID) | ORAL | Status: DC | PRN
  Administered 2016-11-20 – 2016-11-21 (×2): 650 mg via ORAL
  Filled 2016-11-20 (×2): qty 2

## 2016-11-20 MED ORDER — PRAVASTATIN SODIUM 10 MG PO TABS
20.0000 mg | ORAL_TABLET | Freq: Every day | ORAL | Status: DC
  Administered 2016-11-20 – 2016-11-23 (×4): 20 mg via ORAL
  Filled 2016-11-20 (×4): qty 2

## 2016-11-20 MED ORDER — DEXTROSE 5 % IV SOLN
2.0000 g | Freq: Three times a day (TID) | INTRAVENOUS | Status: DC
  Filled 2016-11-20 (×4): qty 2

## 2016-11-20 MED ORDER — DONEPEZIL HCL 5 MG PO TABS
10.0000 mg | ORAL_TABLET | Freq: Two times a day (BID) | ORAL | Status: DC
  Administered 2016-11-20 – 2016-11-23 (×7): 10 mg via ORAL
  Filled 2016-11-20 (×7): qty 2

## 2016-11-20 MED ORDER — VANCOMYCIN HCL 10 G IV SOLR
1500.0000 mg | INTRAVENOUS | Status: AC
  Administered 2016-11-20: 1500 mg via INTRAVENOUS
  Filled 2016-11-20: qty 1500

## 2016-11-20 MED ORDER — CHLORHEXIDINE GLUCONATE CLOTH 2 % EX PADS
6.0000 | MEDICATED_PAD | Freq: Every day | CUTANEOUS | Status: DC
  Administered 2016-11-21 – 2016-11-23 (×3): 6 via TOPICAL

## 2016-11-20 MED ORDER — MUPIROCIN 2 % EX OINT
1.0000 "application " | TOPICAL_OINTMENT | Freq: Two times a day (BID) | CUTANEOUS | Status: DC
  Administered 2016-11-20 – 2016-11-23 (×7): 1 via NASAL
  Filled 2016-11-20 (×3): qty 22

## 2016-11-20 MED ORDER — HEPARIN SODIUM (PORCINE) 5000 UNIT/ML IJ SOLN
5000.0000 [IU] | Freq: Three times a day (TID) | INTRAMUSCULAR | Status: DC
  Administered 2016-11-20 – 2016-11-23 (×10): 5000 [IU] via SUBCUTANEOUS
  Filled 2016-11-20 (×10): qty 1

## 2016-11-20 MED ORDER — VANCOMYCIN HCL IN DEXTROSE 1-5 GM/200ML-% IV SOLN
1000.0000 mg | Freq: Two times a day (BID) | INTRAVENOUS | Status: DC
  Administered 2016-11-20 – 2016-11-23 (×6): 1000 mg via INTRAVENOUS
  Filled 2016-11-20 (×8): qty 200

## 2016-11-20 MED ORDER — MEMANTINE HCL 10 MG PO TABS
10.0000 mg | ORAL_TABLET | Freq: Two times a day (BID) | ORAL | Status: DC
  Administered 2016-11-20 – 2016-11-23 (×7): 10 mg via ORAL
  Filled 2016-11-20 (×7): qty 1

## 2016-11-20 MED ORDER — VITAMIN B-1 100 MG PO TABS
100.0000 mg | ORAL_TABLET | Freq: Every day | ORAL | Status: DC
  Administered 2016-11-20 – 2016-11-23 (×4): 100 mg via ORAL
  Filled 2016-11-20 (×4): qty 1

## 2016-11-20 MED ORDER — VANCOMYCIN HCL 10 G IV SOLR
INTRAVENOUS | Status: AC
  Filled 2016-11-20: qty 1500

## 2016-11-20 MED ORDER — ALBUTEROL SULFATE (2.5 MG/3ML) 0.083% IN NEBU: 2.5000 mg | INHALATION_SOLUTION | RESPIRATORY_TRACT | Status: DC | PRN

## 2016-11-20 MED ORDER — LEVOFLOXACIN IN D5W 750 MG/150ML IV SOLN
750.0000 mg | INTRAVENOUS | Status: DC
  Administered 2016-11-21 – 2016-11-23 (×3): 750 mg via INTRAVENOUS
  Filled 2016-11-20 (×3): qty 150

## 2016-11-20 MED ORDER — ACETAMINOPHEN 650 MG RE SUPP: 650.0000 mg | Freq: Four times a day (QID) | RECTAL | Status: DC | PRN

## 2016-11-20 NOTE — Progress Notes (Addendum)
ANTIBIOTIC CONSULT NOTE-Preliminary  Pharmacy Consult for vancomycin, aztreonam, levofloxacin Indication: sepsis  Allergies  Allergen Reactions  . Penicillins Hives    Patient Measurements: Height: 6\' 4"  (193 cm) Weight: 298 lb (135.2 kg) IBW/kg (Calculated) : 86.8 Adjusted Body Weight:   Vital Signs: Temp: 100.9 F (38.3 C) (01/06 2339) Temp Source: Oral (01/06 2339) BP: 117/69 (01/07 0010) Pulse Rate: 79 (01/07 0010)  Labs:  Recent Labs  11/19/16 2244  WBC 18.3*  HGB 12.9*  PLT 435*  CREATININE 1.89*    Estimated Creatinine Clearance: 56.2 mL/min (by C-G formula based on SCr of 1.89 mg/dL (H)).  No results for input(s): VANCOTROUGH, VANCOPEAK, VANCORANDOM, GENTTROUGH, GENTPEAK, GENTRANDOM, TOBRATROUGH, TOBRAPEAK, TOBRARND, AMIKACINPEAK, AMIKACINTROU, AMIKACIN in the last 72 hours.   Microbiology: Recent Results (from the past 720 hour(s))  Blood culture (routine x 2)     Status: None (Preliminary result)   Collection Time: 11/19/16 10:44 PM  Result Value Ref Range Status   Specimen Description RIGHT ANTECUBITAL  Final   Special Requests BOTTLES DRAWN AEROBIC AND ANAEROBIC [facility]CC EACH  Final   Culture PENDING  Incomplete   Report Status PENDING  Incomplete  Blood culture (routine x 2)     Status: None (Preliminary result)   Collection Time: 11/19/16 10:53 PM  Result Value Ref Range Status   Specimen Description RIGHT ANTECUBITAL  Final   Special Requests BOTTLES DRAWN AEROBIC AND ANAEROBIC [facility]6CC EACH  Final   Culture PENDING  Incomplete   Report Status PENDING  Incomplete    Medical History: Past Medical History:  Diagnosis Date  . COPD (chronic obstructive pulmonary disease) (HCC)   . Dementia   . Hyperlipidemia   . Hypertension   . Renal disorder     Medications:  Scheduled:  . acetaminophen       Infusions:  . sodium chloride 1,000 mL (11/19/16 2351)  . levofloxacin (LEVAQUIN) IV 750 mg (11/19/16 2350)  . vancomycin    . vancomycin 1,000 mg  (11/20/16 0002)   PRN:  Anti-infectives    Start     Dose/Rate Route Frequency Ordered Stop   11/20/16 0030  vancomycin (VANCOCIN) 1,500 mg in sodium chloride 0.9 % 500 mL IVPB     1,500 mg 250 mL/hr over 120 Minutes Intravenous NOW 11/20/16 0025 11/21/16 0030   11/19/16 2315  levofloxacin (LEVAQUIN) IVPB 750 mg     750 mg 100 mL/hr over 90 Minutes Intravenous  Once 11/19/16 2307     11/19/16 2315  aztreonam (AZACTAM) 2 g in dextrose 5 % 50 mL IVPB     2 g 100 mL/hr over 30 Minutes Intravenous  Once 11/19/16 2307 11/20/16 0025   11/19/16 2315  vancomycin (VANCOCIN) IVPB 1000 mg/200 mL premix     1,000 mg 200 mL/hr over 60 Minutes Intravenous  Once 11/19/16 2307        Assessment: 71 yo male presents with fever, tachycardia, hypotension from [facility] facility. Code sepsis initiated. Given levofloxacin 750mg  IV, aztreonam 2 grams IV, and vancomycin 1gram IV in ED.   Goal of Therapy:  Vancomycin trough level 15-20 mcg/ml  Plan:  Preliminary review of pertinent patient information completed.  Protocol will be initiated with a one-time dose(s) of an additional 1500mg  of vancomycin based on patient's weight and next doses of aztreonam 2 grams Q8 hours [facility] clinical pharmacist will complete review during morning rounds to assess patient and finalize treatment regimen.  Given initial BUN/SCr, will monitor closely and may warrant  change in abx if not improved after fluids, maintenance dosing will reflect adjustments for renal function. Current CrCl using cockcroft&gault ABW = 58.9 mL/min. Will continue with planned loading dose of 2500mg  (~18mg /kg TBW) in light of code sepsis and need for adequate AUC/MIC quickly.   [place], Tiffany Scarlett, RPH 11/20/2016,12:30 AM  Addum:  Cont levaquin 750 mg IV q24 hours Cont vancomycin 1gm IV q12 hours F/u renal function, cultures and clinical course  Thanks for allowing pharmacy to be a part of this patient's care.  Talbert Cage,  PharmD Clinical Pharmacist

## 2016-11-20 NOTE — Progress Notes (Signed)
PROGRESS NOTE    Robert SchimkeRussell Edward Wilson  ZOX:[id number] DOB: 04/10/1946 DOA: 11/19/2016 PCP: Pcp Not In System    Brief Narrative: [name]  is a 71 y.o. male, w Dementia, COPD, Hypertension, Stage 1 CKD, presents with sepsis from unclear etiology.  Assessment & Plan:   Active Problems:   Hypotension   Acute renal failure (ARF) (HCC)   Hypokalemia   Sepsis (HCC)   Anemia   Fever   Sepsis of unclear etiology: came in febrile, tachycardic, hypotensive, leukocytosis, in acute renal failure.  Empirically started on IV vancomycin and IV levaquin ,  Blood cultures done on 1/6 and negative till date.  UA is unremarkable , urine cultures negative till date.   C diff PCR negative for toxin and antigen and stool cultures are pending.  CT abdomen and pelvis ordered and pending.  CXR negative for pneumonia.  Influenza PCR is also negative.  Monitor.    Hypotension: resolved.   COPD; no wheezing heard. No indication of steroids.   Acute renal failure:  Baseline creatinine is 1.2, currently 1.4.  Hydrate and repeat in am.   Dementia: no behavioral abnormalities.  Stable.      DVT prophylaxis: heparin SQ Code Status: (Full ) Family Communication: none at bedside.  Disposition Plan: PENDING further eval.    Consultants:   none   Procedures: none   Antimicrobials: vancomycin 1/6, and levaquin 1/6.    Subjective: Denies any complaints, sleeping comfortably.  Objective: Vitals:   11/20/16 1000 11/20/16 1100 11/20/16 1104 11/20/16 1200  BP: (!) 88/58 122/66  (!) 130/93  Pulse: 76 73 75 82  Resp: (!) 28 (!) 25 (!) 23 16  Temp:   98.2 F (36.8 C)   TempSrc:   Oral   SpO2: 93% 92% 94% 95%  Weight:      Height:        Intake/Output Summary (Last 24 hours) at 11/20/16 1224 Last data filed at 11/20/16 0600  Gross per 24 hour  Intake          3868.75 ml  Output                0 ml  Net          3868.75 ml   Filed Weights   11/20/16 0010 11/20/16 0316    Weight: 135.2 kg (298 lb) 124.9 kg (275 lb 5.7 oz)    Examination:  General exam: Appears calm and comfortable  Respiratory system: Clear to auscultation. Respiratory effort normal. Cardiovascular system: S1 & S2 heard, RRR. No JVD, murmurs, rubs, gallops or clicks. Gastrointestinal system: Abdomen is nondistended, soft and nontender. No organomegaly or masses felt. Normal bowel sounds heard. Central nervous system:sleeping, able to answer simple questions, able to move his extremities. Speech normal. Extremities: no pedal edema.  Skin: No rashes, lesions or ulcers     Data Reviewed: I have personally reviewed following labs and imaging studies  CBC:  Recent Labs Lab 11/19/16 2244 11/20/16 0426  WBC 18.3* 21.8*  NEUTROABS 13.6*  --   HGB 12.9* 12.9*  HCT 38.9* 39.1  MCV 98.0 98.7  PLT 435* 445*   Basic Metabolic Panel:  Recent Labs Lab 11/19/16 2244 11/20/16 0426  NA 134* 139  K 3.3* 3.7  CL 104 108  CO2 21* 24  GLUCOSE 132* 110*  BUN 26* 23*  CREATININE 1.89* 1.46*  1.46*  CALCIUM 8.4* 8.3*   GFR: Estimated Creatinine Clearance: 69.9 mL/min (by C-G formula based on SCr  of 1.46 mg/dL (H)). Liver Function Tests:  Recent Labs Lab 11/19/16 2244 11/20/16 0426  AST 46* 41  ALT 40 38  ALKPHOS 53 57  BILITOT 0.9 0.9  PROT 5.9* 6.4*  ALBUMIN 2.9* 3.0*   No results for input(s): LIPASE, AMYLASE in the last 168 hours. No results for input(s): AMMONIA in the last 168 hours. Coagulation Profile: No results for input(s): INR, PROTIME in the last 168 hours. Cardiac Enzymes:  Recent Labs Lab 11/20/16 0426 11/20/16 0857  TROPONINI 0.03* <0.03   BNP (last 3 results) No results for input(s): PROBNP in the last 8760 hours. HbA1C: No results for input(s): HGBA1C in the last 72 hours. CBG: No results for input(s): GLUCAP in the last 168 hours. Lipid Profile: No results for input(s): CHOL, HDL, LDLCALC, TRIG, CHOLHDL, LDLDIRECT in the last 72  hours. Thyroid Function Tests: No results for input(s): TSH, T4TOTAL, FREET4, T3FREE, THYROIDAB in the last 72 hours. Anemia Panel: No results for input(s): VITAMINB12, FOLATE, FERRITIN, TIBC, IRON, RETICCTPCT in the last 72 hours. Sepsis Labs:  Recent Labs Lab 11/19/16 2244 11/20/16 0131  LATICACIDVEN 1.1 1.1    Recent Results (from the past 240 hour(s))  Blood culture (routine x 2)     Status: None (Preliminary result)   Collection Time: 11/19/16 10:44 PM  Result Value Ref Range Status   Specimen Description RIGHT ANTECUBITAL  Final   Special Requests BOTTLES DRAWN AEROBIC AND ANAEROBIC 6CC EACH  Final   Culture NO GROWTH < 12 HOURS  Final   Report Status PENDING  Incomplete  Blood culture (routine x 2)     Status: None (Preliminary result)   Collection Time: 11/19/16 10:53 PM  Result Value Ref Range Status   Specimen Description RIGHT ANTECUBITAL  Final   Special Requests BOTTLES DRAWN AEROBIC AND ANAEROBIC 6CC EACH  Final   Culture NO GROWTH < 12 HOURS  Final   Report Status PENDING  Incomplete  MRSA PCR Screening     Status: Abnormal   Collection Time: 11/20/16  3:13 AM  Result Value Ref Range Status   MRSA by PCR POSITIVE (A) NEGATIVE Final    Comment:        The GeneXpert MRSA Assay (FDA approved for NASAL specimens only), is one component of a comprehensive MRSA colonization surveillance program. It is not intended to diagnose MRSA infection nor to guide or monitor treatment for MRSA infections. RESULT CALLED TO, READ BACK BY AND VERIFIED WITH: PHILLIPS,C AT 1056 ON 11/14/2016 BY MOSLEY,J   C difficile quick scan w PCR reflex     Status: None   Collection Time: 11/20/16  9:53 AM  Result Value Ref Range Status   C Diff antigen NEGATIVE NEGATIVE Final   C Diff toxin NEGATIVE NEGATIVE Final   C Diff interpretation No C. difficile detected.  Final         Radiology Studies: Dg Chest Portable 1 View  Result Date: 11/19/2016 CLINICAL DATA:  Fever up to  103.5 EXAM: PORTABLE CHEST 1 VIEW COMPARISON:  10/31/2016 FINDINGS: No acute infiltrate or effusion. Stable cardiomediastinal silhouette with tortuous aorta. No pneumothorax. IMPRESSION: No acute infiltrate or edema Electronically Signed   By: Jasmine Pang M.D.   On: 11/19/2016 22:28        Scheduled Meds: . aspirin EC  81 mg Oral Daily  . [START ON 11/21/2016] Chlorhexidine Gluconate Cloth  6 each Topical Q0600  . donepezil  10 mg Oral BID  . heparin  5,000 Units Subcutaneous  Q8H  . [START ON 11/21/2016] levofloxacin (LEVAQUIN) IV  750 mg Intravenous Q24H  . memantine  10 mg Oral BID  . metroNIDAZOLE  500 mg Oral Q8H  . mupirocin ointment  1 application Nasal BID  . pravastatin  20 mg Oral Daily  . thiamine  100 mg Oral Daily  . vancomycin  1,000 mg Intravenous Q12H   Continuous Infusions: . sodium chloride 1,000 mL (11/20/16 0847)     LOS: 0 days    Time spent: 25 min    Mackenzy Eisenberg, MD [facility] Pager [phone]  If 7PM-7AM, please contact night-coverage [email or web address] Password [facility] 11/20/2016, 12:24 PM

## 2016-11-20 NOTE — ED Notes (Signed)
Tiffany with pharmacy states the pt is to have a total of 1500mg .

## 2016-11-20 NOTE — H&P (Addendum)
TRH H&P   Patient Demographics:    Robert Wilson, is a 71 y.o. male  MRN: [id number]   DOB - [birth year]  Admit Date - 11/19/2016  Outpatient Primary MD for the patient is Pcp Not In System  Anson FretFernando Ariza  Referring MD/NP/PA: Ward   Outpatient Specialists:     Patient coming [facility]: [facility]  Chief Complaint  Patient presents with  . Fever      HPI:    Robert MuffRussell Dehaven  is a 71 y.o. male, w Dementia apparently recently discharged after enteritis and sent to SNF.  Apparently this morning pt was hypotensive sbp 90's and having fever. Pt sent to ED for evaluation.   In ED, pt was noted to be in Acute on Chronic renal failure w bun /creatinine 26/1.89.  Pt noted to be hypotensive w leukocytosis wbc 18.3.  Pt was also noted to be hypokalemic.  Pt will be admitted for hypotension, sepsis of unclear etiology, and ARF.    Review of systems:    Note history/ros limited by dementia In addition to the HPI above,  No Fever-chills, No Headache, No changes with Vision or hearing, No problems swallowing food or Liquids, No Chest pain, Cough or Shortness of Breath, No Abdominal pain, No Nausea or Vommitting, ? Diarrhea,  No Blood in stool or Urine, No dysuria, No new skin rashes or bruises, No new joints pains-aches,  No new weakness, tingling, numbness in any extremity, No recent weight gain or loss, No polyuria, polydypsia or polyphagia, No significant Mental Stressors.  A full 10 point Review of Systems was done, except as stated above, all other Review of Systems were negative.   With Past History of the following :    Past Medical History:  Diagnosis Date  . COPD (chronic obstructive pulmonary disease) (HCC)   . Dementia   . Hyperlipidemia   . Hypertension   . Renal disorder       Past Surgical History:  Procedure Laterality Date  . ABDOMINAL AORTIC ANEURYSM  REPAIR    . COLONOSCOPY W/ POLYPECTOMY    . KNEE SURGERY     R knee  . VASECTOMY        Social History:     Social History  Substance Use Topics  . Smoking status: Current Every Day Smoker    Packs/day: 2.00    Types: Cigarettes  . Smokeless tobacco: Never Used  . Alcohol use No     Lives - at [facility]  Mobility - uncertain   Family History :     Family History  Problem Relation Age of Onset  . Cancer Father       Home Medications:   Prior to Admission medications   Medication Sig Start Date End Date Taking? Authorizing Provider  albuterol (PROVENTIL) (2.5 MG/3ML) 0.083% nebulizer solution Take 2.5 mg by nebulization every 4 (four) hours as  needed for wheezing or shortness of breath.   Yes Historical Provider, MD  aspirin 81 MG tablet Take 81 mg by mouth daily.   Yes Historical Provider, MD  atenolol (TENORMIN) 25 MG tablet Take 12.5 mg by mouth daily.   Yes Historical Provider, MD  benzocaine-menthol (CHLORASEPTIC) 6-10 MG lozenge Take 1 lozenge by mouth every 2 (two) hours as needed for sore throat.   Yes Historical Provider, MD  bisacodyl (DULCOLAX) 5 MG EC tablet Take 10 mg by mouth daily as needed for mild constipation or moderate constipation.  11/03/16 12/03/16 Yes Historical Provider, MD  cetirizine (ZYRTEC) 10 MG tablet Take 10 mg by mouth daily.   Yes Historical Provider, MD  Cholecalciferol (VITAMIN D) 2000 units CAPS Take 1 capsule by mouth daily.   Yes Historical Provider, MD  donepezil (ARICEPT) 10 MG tablet Take 10 mg by mouth 2 (two) times daily.   Yes Historical Provider, MD  fish oil-omega-3 fatty acids 1000 MG capsule Take 2 g by mouth daily.   Yes Historical Provider, MD  glucosamine-chondroitin 500-400 MG tablet Take 1 tablet by mouth daily.    Yes Historical Provider, MD  guaiFENesin (ROBITUSSIN) 100 MG/5ML SOLN Take 10 mLs by mouth every 4 (four) hours as needed for cough or to loosen phlegm.   Yes Historical Provider, MD  memantine (NAMENDA) 10  MG tablet Take 10 mg by mouth 2 (two) times daily.  11/01/16  Yes Historical Provider, MD  perphenazine (TRILAFON) 4 MG tablet Take 4 mg by mouth at bedtime.  10/17/16  Yes Historical Provider, MD  pravastatin (PRAVACHOL) 20 MG tablet Take 20 mg by mouth daily.   Yes Historical Provider, MD  QUEtiapine (SEROQUEL) 25 MG tablet Take 25 mg by mouth 3 (three) times daily.  11/03/16  Yes Historical Provider, MD  thiamine (VITAMIN B-1) 100 MG tablet Take 100 mg by mouth daily.   Yes Historical Provider, MD  vitamin C (ASCORBIC ACID) 500 MG tablet Take 500 mg by mouth daily.   Yes Historical Provider, MD  vitamin E 400 UNIT capsule Take 400 Units by mouth daily.   Yes Historical Provider, MD     Allergies:     Allergies  Allergen Reactions  . Penicillins Hives     Physical Exam:   Vitals  Blood pressure 109/75, pulse 78, temperature 100.9 F (38.3 C), temperature source Oral, resp. rate 20, height 6\' 4"  (1.93 m), weight 135.2 kg (298 lb), SpO2 97 %.   1. General  lying in bed in NAD,    2. Normal affect and insight, Not Suicidal or Homicidal, Awake Alert, Oriented X 3.  3. No F.N deficits, ALL C.Nerves Intact, Strength 5/5 all 4 extremities, Sensation intact all 4 extremities, Plantars down going.  4. Ears and Eyes appear Normal, Conjunctivae clear, PERRLA. Moist Oral Mucosa.  5. Supple Neck, No JVD, No cervical lymphadenopathy appriciated, No Carotid Bruits.  6. Symmetrical Chest wall movement, Good air movement bilaterally, CTAB.  7. RRR, No Gallops, Rubs or Murmurs, No Parasternal Heave.  8. Positive Bowel Sounds, Abdomen Soft, No tenderness, No organomegaly appriciated,No rebound -guarding or rigidity.  9.  No Cyanosis, Normal Skin Turgor, No Skin Rash or Bruise.  10. Good muscle tone,  joints appear normal , no effusions, Normal ROM.  11. No Palpable Lymph Nodes in Neck or Axillae     Data Review:    CBC  Recent Labs Lab 11/19/16 2244  WBC 18.3*  HGB 12.9*  HCT  38.9*  PLT  435*  MCV 98.0  MCH 32.5  MCHC 33.2  RDW 13.2  LYMPHSABS 2.5  MONOABS 2.2*  EOSABS 0.0  BASOSABS 0.0   ------------------------------------------------------------------------------------------------------------------  Chemistries   Recent Labs Lab 11/19/16 2244  NA 134*  K 3.3*  CL 104  CO2 21*  GLUCOSE 132*  BUN 26*  CREATININE 1.89*  CALCIUM 8.4*  AST 46*  ALT 40  ALKPHOS 53  BILITOT 0.9   ------------------------------------------------------------------------------------------------------------------ estimated creatinine clearance is 56.2 mL/min (by C-G formula based on SCr of 1.89 mg/dL (H)). ------------------------------------------------------------------------------------------------------------------ No results for input(s): TSH, T4TOTAL, T3FREE, THYROIDAB in the last 72 hours.  Invalid input(s): FREET3  Coagulation profile No results for input(s): INR, PROTIME in the last 168 hours. ------------------------------------------------------------------------------------------------------------------- No results for input(s): DDIMER in the last 72 hours. -------------------------------------------------------------------------------------------------------------------  Cardiac Enzymes No results for input(s): CKMB, TROPONINI, MYOGLOBIN in the last 168 hours.  Invalid input(s): CK ------------------------------------------------------------------------------------------------------------------ No results found for: BNP   ---------------------------------------------------------------------------------------------------------------  Urinalysis    Component Value Date/Time   COLORURINE YELLOW 11/17/2013 0345   APPEARANCEUR CLEAR 11/17/2013 0345   LABSPEC 1.025 11/17/2013 0345   PHURINE 6.0 11/17/2013 0345   GLUCOSEU NEGATIVE 11/17/2013 0345   HGBUR NEGATIVE 11/17/2013 0345   BILIRUBINUR NEGATIVE 11/17/2013 0345   KETONESUR NEGATIVE  11/17/2013 0345   PROTEINUR NEGATIVE 11/17/2013 0345   UROBILINOGEN 0.2 11/17/2013 0345   NITRITE NEGATIVE 11/17/2013 0345   LEUKOCYTESUR NEGATIVE 11/17/2013 0345    ----------------------------------------------------------------------------------------------------------------   Imaging Results:    Dg Chest Portable 1 View  Result Date: 11/19/2016 CLINICAL DATA:  Fever up to 103.5 EXAM: PORTABLE CHEST 1 VIEW COMPARISON:  10/31/2016 FINDINGS: No acute infiltrate or effusion. Stable cardiomediastinal silhouette with tortuous aorta. No pneumothorax. IMPRESSION: No acute infiltrate or edema Electronically Signed   By: Jasmine Pang M.D.   On: 11/19/2016 22:28      Assessment & Plan:    Active Problems:   Hypotension   Acute renal failure (ARF) (HCC)   Hypokalemia   Sepsis (HCC)   Anemia    1. Hypotension Check cortisol Check trop I q6h x3 Hydrate with ns iv Check cardiac echo  2. ARF Check urine sodium, urine creatinine, urine eosinophils Check abdominal ultrasound Hydrate with ns iv  3. Fever ? Sepsis/ Diarrhea vs unknown etiology tx with vanco, levaquin,  flagyl  Stool studies, for fecal leukocytes, culture, c. Diff  4. Anemia Check cbc in am If worsening please check fobt in am  5. Protein Calorie Malnutrition (severe) prostat  6.  Hypokalemia Replete Check cmp in am   DVT Prophylaxis Heparin -  - SCDs  AM Labs Ordered, also please review Full Orders  Family Communication: Admission, patients condition and plan of care including tests being ordered have been discussed with the patient  who indicate understanding and agree with the plan and Code Status.  Code Status FULL CODE  Likely DC to  [facility]  Condition GUARDED    Consults called:   Admission status:  inpatient  Time spent in minutes : 50 minutes   Pearson Grippe M.D on 11/20/2016 at 1:35 AM  Between 7am to 7pm - Pager - [phone] After 7pm go to [email or web address] - password [facility]  [facility] - Office  [phone]

## 2016-11-20 NOTE — Progress Notes (Signed)
*  PRELIMINARY RESULTS* Echocardiogram 2D Echocardiogram has been performed.  Stacey DrainWhite, Gareth Fitzner J 11/20/2016, 4:28 PM

## 2016-11-21 LAB — BASIC METABOLIC PANEL
ANION GAP: 7 (ref 5–15)
BUN: 16 mg/dL (ref 6–20)
CALCIUM: 8.6 mg/dL — AB (ref 8.9–10.3)
CO2: 24 mmol/L (ref 22–32)
CREATININE: 1.08 mg/dL (ref 0.61–1.24)
Chloride: 106 mmol/L (ref 101–111)
GFR calc Af Amer: >60 mL/min (ref >60)
Glucose, Bld: 130 mg/dL — ABNORMAL HIGH (ref 65–99)
Potassium: 3.5 mmol/L (ref 3.5–5.1)
Sodium: 137 mmol/L (ref 135–145)

## 2016-11-21 LAB — URINE CULTURE: Culture: NO GROWTH

## 2016-11-21 MED ORDER — SODIUM CHLORIDE 0.9 % IV SOLN
1000.0000 mL | INTRAVENOUS | Status: DC
  Administered 2016-11-21: 1000 mL via INTRAVENOUS

## 2016-11-21 NOTE — Progress Notes (Signed)
PROGRESS NOTE    Robert Wilson  XBJ:[id number] DOB: [birth year] DOA: 11/19/2016 PCP: Pcp Not In System    Brief Narrative: Robert Wilson  is a 71 y.o. male, w Dementia, COPD, Hypertension, Stage 1 CKD, presents with sepsis FROM HEALTH care associated pneumonia.   Assessment & Plan:   Active Problems:   Hypotension   Acute renal failure (ARF) (HCC)   Hypokalemia   Sepsis (HCC)   Anemia   Fever   Sepsis from right sided pneumonia/ health care associated pneumonia.  came in febrile, tachycardic, hypotensive, leukocytosis, in acute renal failure.  Empirically started on IV vancomycin and IV levaquin , will cover hcap. Blood cultures done on 1/6 and negative till date.  UA is unremarkable , urine cultures negative till date.   C diff PCR negative for toxin and antigen and stool cultures are pending.  CT abdomen and pelvis ordered , showed right sided pneumonia.  CXR negative for pneumonia.  Influenza PCR is also negative.  Monitor.    Hypotension: resolved.   COPD; no wheezing heard. No indication of steroids.   Acute renal failure:  Baseline creatinine is 1.2, currently 1.4.  Hydrate and repeat in am. Pending labs today.   Dementia: no behavioral abnormalities.  Stable.      DVT prophylaxis: heparin SQ Code Status: (Full ) Family Communication: none at bedside.  Disposition Plan: PENDING further eval.    Consultants:   none   Procedures: none   Antimicrobials: vancomycin 1/6, and levaquin 1/6.    Subjective: Denies any complaints, reports feeling better.   Objective: Vitals:   11/21/16 0900 11/21/16 1000 11/21/16 1100 11/21/16 1200  BP: 126/82 136/81 135/73 107/68  Pulse: 73 85 79   Resp: (!) 29 (!) 21 (!) 26 (!) 27  Temp:    97.5 F (36.4 C)  TempSrc:    Oral  SpO2: 94% 92% 95%   Weight:      Height:        Intake/Output Summary (Last 24 hours) at 11/21/16 1549 Last data filed at 11/21/16 1244  Gross per 24 hour  Intake           4146.67 ml  Output              700 ml  Net          3446.67 ml   Filed Weights   11/20/16 0010 11/20/16 0316 11/21/16 0500  Weight: 135.2 kg (298 lb) 124.9 kg (275 lb 5.7 oz) 126.9 kg (279 lb 12.2 oz)    Examination:  General exam: Appears calm and comfortable  Respiratory system: Clear to auscultation. Respiratory effort normal. Cardiovascular system: S1 & S2 heard, RRR. No JVD, murmurs, rubs, gallops or clicks. Gastrointestinal system: Abdomen is nondistended, soft and nontender. No organomegaly or masses felt. Normal bowel sounds heard. Central nervous system:sleeping, able to answer simple questions, able to move his extremities. Speech normal. Extremities: no pedal edema.  Skin: No rashes, lesions or ulcers     Data Reviewed: I have personally reviewed following labs and imaging studies  CBC:  Recent Labs Lab 11/19/16 2244 11/20/16 0426  WBC 18.3* 21.8*  NEUTROABS 13.6*  --   HGB 12.9* 12.9*  HCT 38.9* 39.1  MCV 98.0 98.7  PLT 435* 445*   Basic Metabolic Panel:  Recent Labs Lab 11/19/16 2244 11/20/16 0426  NA 134* 139  K 3.3* 3.7  CL 104 108  CO2 21* 24  GLUCOSE 132* 110*  BUN 26* 23*  CREATININE 1.89* 1.46*  1.46*  CALCIUM 8.4* 8.3*   GFR: Estimated Creatinine Clearance: 70.4 mL/min (by C-G formula based on SCr of 1.46 mg/dL (H)). Liver Function Tests:  Recent Labs Lab 11/19/16 2244 11/20/16 0426  AST 46* 41  ALT 40 38  ALKPHOS 53 57  BILITOT 0.9 0.9  PROT 5.9* 6.4*  ALBUMIN 2.9* 3.0*   No results for input(s): LIPASE, AMYLASE in the last 168 hours. No results for input(s): AMMONIA in the last 168 hours. Coagulation Profile: No results for input(s): INR, PROTIME in the last 168 hours. Cardiac Enzymes:  Recent Labs Lab 11/20/16 0426 11/20/16 0857 11/20/16 1513  TROPONINI 0.03* <0.03 <0.03   BNP (last 3 results) No results for input(s): PROBNP in the last 8760 hours. HbA1C: No results for input(s): HGBA1C in the last 72  hours. CBG: No results for input(s): GLUCAP in the last 168 hours. Lipid Profile: No results for input(s): CHOL, HDL, LDLCALC, TRIG, CHOLHDL, LDLDIRECT in the last 72 hours. Thyroid Function Tests: No results for input(s): TSH, T4TOTAL, FREET4, T3FREE, THYROIDAB in the last 72 hours. Anemia Panel: No results for input(s): VITAMINB12, FOLATE, FERRITIN, TIBC, IRON, RETICCTPCT in the last 72 hours. Sepsis Labs:  Recent Labs Lab 11/19/16 2244 11/20/16 0131  LATICACIDVEN 1.1 1.1    Recent Results (from the past 240 hour(s))  Blood culture (routine x 2)     Status: None (Preliminary result)   Collection Time: 11/19/16 10:44 PM  Result Value Ref Range Status   Specimen Description RIGHT ANTECUBITAL  Final   Special Requests BOTTLES DRAWN AEROBIC AND ANAEROBIC 6CC EACH  Final   Culture NO GROWTH 2 DAYS  Final   Report Status PENDING  Incomplete  Blood culture (routine x 2)     Status: None (Preliminary result)   Collection Time: 11/19/16 10:53 PM  Result Value Ref Range Status   Specimen Description RIGHT ANTECUBITAL  Final   Special Requests BOTTLES DRAWN AEROBIC AND ANAEROBIC 6CC EACH  Final   Culture NO GROWTH 2 DAYS  Final   Report Status PENDING  Incomplete  Urine culture     Status: None   Collection Time: 11/20/16  1:11 AM  Result Value Ref Range Status   Specimen Description URINE, CATHETERIZED  Final   Special Requests NONE  Final   Culture NO GROWTH Performed at [facility]   Final   Report Status 11/21/2016 FINAL  Final  MRSA PCR Screening     Status: Abnormal   Collection Time: 11/20/16  3:13 AM  Result Value Ref Range Status   MRSA by PCR POSITIVE (A) NEGATIVE Final    Comment:        The GeneXpert MRSA Assay (FDA approved for NASAL specimens only), is one component of a comprehensive MRSA colonization surveillance program. It is not intended to diagnose MRSA infection nor to guide or monitor treatment for MRSA infections. RESULT CALLED TO, READ  BACK BY AND VERIFIED WITH: PHILLIPS,C AT 1056 ON 11/14/2016 BY MOSLEY,J   C difficile quick scan w PCR reflex     Status: None   Collection Time: 11/20/16  9:53 AM  Result Value Ref Range Status   C Diff antigen NEGATIVE NEGATIVE Final   C Diff toxin NEGATIVE NEGATIVE Final   C Diff interpretation No C. difficile detected.  Final         Radiology Studies: Ct Abdomen Pelvis Wo Contrast  Result Date: 11/20/2016 CLINICAL DATA:  Sepsis of unclear etiology: came in febrile, tachycardic,  hypotensive, leukocytosis, in acute renal failure. Hypotension, COPD/ dementia. EXAM: CT ABDOMEN AND PELVIS WITHOUT CONTRAST TECHNIQUE: Multidetector CT imaging of the abdomen and pelvis was performed following the standard protocol without IV contrast. COMPARISON:  11/17/2013 FINDINGS: Lower chest: There is focal airspace consolidation in posterolateral right lower lobe abutting the pleural margin measuring approximately 4.8 x 2.1 x 3.5 cm. This is new when compared to the prior exam. Lung bases otherwise clear. Heart normal in size. Hepatobiliary: 3 small low-density liver lesions, largest centrally measuring 12 mm. Another subtle low-density lesion lies in the anterior aspect of the medial segment left lobe with a third in the lateral aspect of the posterior segment of the right lobe. These were present previously and are consistent with cysts. No other liver abnormality. There is dependent density in the gallbladder consistent with small stones. No evidence of acute cholecystitis. No bile duct dilation. Pancreas: Subtle haziness is noted in the fat adjacent to the pancreatic head and main portal vein. Pancreas otherwise unremarkable with no masses. Surgical vascular clips are noted adjacent to the pancreas and below the superior mesenteric artery, stable from the prior study. Spleen: Spleen is small, but stable.  No splenic masses. Adrenals/Urinary Tract: Left adrenal gland nodular thickening with milder thickening of  the right adrenal gland, stable from the prior study. No renal masses or stones. No hydronephrosis. Normal ureters. Bladder is mostly decompressed but otherwise unremarkable. Stomach/Bowel: Stomach is within normal limits. Appendix appears normal. No evidence of bowel wall thickening, distention, or inflammatory changes. Vascular/Lymphatic: There is diffuse aortic ectasia. There is a focal dilation of the infrarenal abdominal aorta just above the bifurcation measuring a maximum of 4.6 cm from anterior-posterior, previously 4.3 cm. There is focal dilation of the left common iliac artery at its bifurcation measuring 3.1 cm, previously 2.8 cm. Dilation of the right common iliac artery is noted to 2.3 cm previously 1.9 cm. There is diffuse atherosclerotic plaque along the abdominal aorta and iliac vessels. There are prominent to mildly enlarged gastrohepatic ligament lymph nodes, largest measuring 17 mm in short axis, without change from the prior CT. No other adenopathy. Reproductive: Unremarkable. Other: No significant additional abnormalities in the abdomen or pelvis. Musculoskeletal: No acute finding. No osteoblastic or osteolytic lesions. IMPRESSION: 1. Right lower lobe pneumonia.  This is likely the source of sepsis. 2. No convincing acute findings within the abdomen or pelvis. 3. Small liver cyst, relatively stable from the prior CT. 4. Cholelithiasis.  No acute cholecystitis. 5. 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 Infrarenal abdominal aortic aneurysm measures 4.6 cm. Recommend followup by abdomen and pelvis CTA in 6 months, and vascular surgery referral/consultation if not already obtained. This recommendation follows ACR consensus guidelines: White Paper of the ACR Incidental Findings Committee II on Vascular Findings. J Am Coll Radiol 2013; 10:789-794. 6. Mild reactive  adenopathy along the gastrohepatic ligament stable from the prior CT. Nodular enlargement of the adrenal glands also stable from the prior CT. Electronically Signed   By: Amie Portland M.D.   On: 11/20/2016 17:21   Dg Chest Portable 1 View  Result Date: 11/19/2016 CLINICAL DATA:  Fever up to 103.5 EXAM: PORTABLE CHEST 1 VIEW COMPARISON:  10/31/2016 FINDINGS: No acute infiltrate or effusion. Stable cardiomediastinal silhouette with tortuous aorta. No pneumothorax. IMPRESSION: No acute infiltrate or edema Electronically Signed   By: Adrian Prows.D.  On: 11/19/2016 22:28        Scheduled Meds: . aspirin EC  81 mg Oral Daily  . Chlorhexidine Gluconate Cloth  6 each Topical Q0600  . donepezil  10 mg Oral BID  . heparin  5,000 Units Subcutaneous Q8H  . levofloxacin (LEVAQUIN) IV  750 mg Intravenous Q24H  . memantine  10 mg Oral BID  . mupirocin ointment  1 application Nasal BID  . pravastatin  20 mg Oral Daily  . thiamine  100 mg Oral Daily  . vancomycin  1,000 mg Intravenous Q12H   Continuous Infusions: . sodium chloride 1,000 mL (11/21/16 1244)     LOS: 1 day    Time spent: 25 min    Steffani Dionisio, MD [facility] Pager [phone]  If 7PM-7AM, please contact night-coverage [email or web address] Password [email or web address] 11/21/2016, 3:49 PM

## 2016-11-21 NOTE — Progress Notes (Signed)
PT Cancellation Note  Patient Details Name: Robert SchimkeRussell Edward Obando MRN: [id number] DOB: 1945-12-08   Cancelled Treatment:    Attempted evaluation but RN recommends hold at this time due to significant agitation/confusion of patient. Will potentially try again on 11/22/16.    Nedra HaiKristen Roxana Lai PT, DPT [phone]

## 2016-11-21 NOTE — Evaluation (Signed)
Clinical/Bedside Swallow Evaluation Patient Details  Name: Robert SchimkeRussell Edward Brevik MRN: [id number] Date of Birth: 05-22-1946  Today's Date: 11/21/2016 Time: SLP Start Time (ACUTE ONLY): 1540 SLP Stop Time (ACUTE ONLY): 1620 SLP Time Calculation (min) (ACUTE ONLY): 40 min  Past Medical History:  Past Medical History:  Diagnosis Date  . COPD (chronic obstructive pulmonary disease) (HCC)   . Dementia   . Hyperlipidemia   . Hypertension   . Renal disorder    Past Surgical History:  Past Surgical History:  Procedure Laterality Date  . ABDOMINAL AORTIC ANEURYSM REPAIR    . COLONOSCOPY W/ POLYPECTOMY    . KNEE SURGERY     R knee  . VASECTOMY     HPI:       Robert Wilson  is a 71 y.o. male, w Dementia from SNF. Sunday morning pt was hypotensive sbp 90's and having fever. Pt sent to ED for evaluation. In ED, pt was noted to be in Acute on Chronic renal failure w bun /creatinine 26/1.89.  Pt noted to be hypotensive w leukocytosis wbc 18.3.  Pt was also noted to be hypokalemic.  Pt will be admitted for hypotension, sepsis of unclear etiology, and ARF. PMH: HTN, COPD, hyperlipidemia. Chest x-ray results:  FINDINGS: No acute infiltrate or effusion. Stable cardiomediastinal silhouette with tortuous aorta. No pneumothorax. IMPRESSION: No acute infiltrate or edema. BSE ordered to r/o aspiration.       Assessment / Plan / Recommendation Clinical Impression   Pt was agitated and confused at times during evaluation today, however he participated with no difficulty. He asked for his dentures several times throughout visit  (nsg states they are at  [facility]).  Hyolaryngeal elevation [facility] using palpation, he produced volitional  strong cough and throat clear, timing of dry swallow was 3-4 seconds. He consumed ice chips x4, thin water via spoon x4, cup sip x4, and straw x4 (pt controlled all accept thin with spoon and ice chip presentations) - vocal quality was [facility], he demonstrated timely swallow, no s/sx of  aspiration were noted across trials. He then consumed applesauce x4, 1 graham cracker with applesauce, and 1 regular graham cracker with no s/sx of aspiration across trials, mastication was adequate despite lack of dentition. He presents with good lingual strength and coordination. He has deen on a regular consistency  Diet with thin liquids since 11/20/15 (he states that he was on a regular/thin liquid diet at [facility], however unsure d/t his cognitive status). ST recommended thin liquids and regular consistency solids, pt is agreeable - spoke with nsg about recommendations. Plan to follow up this week  to ensure safe tolerance of diet.     Aspiration Risk  Mild aspiration risk    Diet Recommendation Regular;Thin liquid   Liquid Administration via: Straw;Cup;Spoon Medication Administration: Whole meds with liquid Supervision: Patient able to self feed Postural Changes: Seated upright at 90 degrees;Remain upright for at least 30 minutes after po intake    Other  Recommendations Oral Care Recommendations: Oral care BID   Follow up Recommendations Other (comment) (follow up on acute to check diet )      Frequency and Duration min 1 x/week  1 week       Prognosis Barriers to Reach Goals: Cognitive deficits      Swallow Study   General Date of Onset: 11/21/16 Type of Study: Bedside Swallow Evaluation Previous Swallow Assessment: none listed Diet Prior to this Study: Regular;Thin liquids Temperature Spikes Noted: Yes Respiratory Status: Room air History  of Recent Intubation: No Behavior/Cognition: Confused;Agitated;Alert Oral Cavity Assessment: Within Functional Limits Oral Care Completed by SLP: No Oral Cavity - Dentition: Other (Comment) (front upper dentition only ) Vision: Functional for self-feeding Self-Feeding Abilities: Able to feed self Patient Positioning: Other (comment);Postural control adequate for testing (sitting upright, side of bed ) Baseline Vocal Quality:  Normal Volitional Cough: Strong Volitional Swallow: Able to elicit    Oral/Motor/Sensory Function Overall Oral Motor/Sensory Function: Within functional limits   Ice Chips Ice chips: Within functional limits Presentation: Spoon   Thin Liquid Thin Liquid: Within functional limits Presentation: Cup;Self Fed;Spoon;Straw    Nectar Thick     Honey Thick     Puree Puree: Within functional limits Presentation: Self Fed   Solid   Thank you,   Robert Wilson. Robert Falconer MS, CCC-SLP  Speech-Language Pathologist [phone]       Solid: Within functional limits Presentation: Self Fed        Robert Wilson 11/21/2016,4:23 PM

## 2016-11-21 NOTE — Care Management Important Message (Signed)
Important Message  Patient Details  Name: Robert SchimkeRussell Edward Haberer MRN: [id number] Date of Birth: 02/15/1946   Medicare Important Message Given:  Yes    Clotilda Hafer, Chrystine OilerSharley Diane, RN 11/21/2016, 1:55 PM

## 2016-11-21 NOTE — Care Management Note (Signed)
Case Management Note  Patient Details  Name: Laurette SchimkeRussell Edward Branscum MRN: [id number] Date of Birth: 03-09-1946  Subjective/Objective:  Patient adm from [facility] with hypotension/ PNA. Plan is to return to [facility] at time of discharge.               Action/Plan: CSW aware of admission and will make arrangements for return.   Expected Discharge Date:  11/21/16               Expected Discharge Plan:  Skilled Nursing Facility  In-House Referral:  Clinical Social Work  Discharge planning Services  CM Consult  Post Acute Care Choice:    Choice offered to:     DME Arranged:    DME Agency:     HH Arranged:    HH Agency:     Status of Service:  Completed, signed off  If discussed at [employer] Meetings, dates discussed:    Additional Comments:  [facility] Knee, Chrystine OilerSharley Diane, RN 11/21/2016, 1:50 PM

## 2016-11-21 NOTE — Progress Notes (Signed)
Patient became increasingly confused as day went on, had stood up and pulled off all telemetry connections and his IV. Was able to calm him down after reorienting him. Patient was concerned about missing dentures. I had called [facility] where he had come from and the nurse reported to me they were in his room. Patient requested to call wife so she was contacted and updated.

## 2016-11-22 DIAGNOSIS — J189 Pneumonia, unspecified organism: Secondary | ICD-10-CM | POA: Diagnosis present

## 2016-11-22 LAB — BASIC METABOLIC PANEL
Anion gap: 6 (ref 5–15)
BUN: 13 mg/dL (ref 6–20)
CALCIUM: 8.6 mg/dL — AB (ref 8.9–10.3)
CO2: 24 mmol/L (ref 22–32)
CREATININE: 0.85 mg/dL (ref 0.61–1.24)
Chloride: 106 mmol/L (ref 101–111)
GFR calc Af Amer: >60 mL/min (ref >60)
Glucose, Bld: 99 mg/dL (ref 65–99)
POTASSIUM: 3.1 mmol/L — AB (ref 3.5–5.1)
SODIUM: 136 mmol/L (ref 135–145)

## 2016-11-22 LAB — CBC
HCT: 34.2 % — ABNORMAL LOW (ref 39.0–52.0)
Hemoglobin: 11.8 g/dL — ABNORMAL LOW (ref 13.0–17.0)
MCH: 32.9 pg (ref 26.0–34.0)
MCHC: 34.5 g/dL (ref 30.0–36.0)
MCV: 95.3 fL (ref 78.0–100.0)
PLATELETS: 485 10*3/uL — AB (ref 150–400)
RBC: 3.59 MIL/uL — AB (ref 4.22–5.81)
RDW: 13.5 % (ref 11.5–15.5)
WBC: 14.5 10*3/uL — ABNORMAL HIGH (ref 4.0–10.5)

## 2016-11-22 MED ORDER — LORAZEPAM 2 MG/ML IJ SOLN
1.0000 mg | Freq: Once | INTRAMUSCULAR | Status: AC
  Administered 2016-11-22: 1 mg via INTRAVENOUS
  Filled 2016-11-22: qty 1

## 2016-11-22 MED ORDER — POTASSIUM CHLORIDE 20 MEQ PO PACK
40.0000 meq | PACK | Freq: Two times a day (BID) | ORAL | Status: DC
  Administered 2016-11-22 – 2016-11-23 (×3): 40 meq via ORAL
  Filled 2016-11-22 (×3): qty 2

## 2016-11-22 MED ORDER — LEVOFLOXACIN 750 MG PO TABS: 750.0000 mg | ORAL_TABLET | Freq: Every day | ORAL | 0 refills | Status: AC

## 2016-11-22 MED ORDER — MUPIROCIN 2 % EX OINT: 1.0000 "application " | TOPICAL_OINTMENT | Freq: Two times a day (BID) | CUTANEOUS | 0 refills | Status: AC

## 2016-11-22 NOTE — Discharge Summary (Signed)
Physician Discharge Summary  Robert Wilson RUE:[id number] DOB: 04-Nov-1946 DOA: 11/19/2016  PCP: Pcp Not In System  Admit date: 11/19/2016 Discharge date: 11/23/2016  Admitted From: snf Disposition:  SNF  Recommendations for Outpatient Follow-up:  1. Follow up with PCP in 1-2 weeks 2. Please obtain BMP/CBC in one week     Discharge Condition:stable.  CODE STATUS: full code.  Diet recommendation: Heart Healthy  Brief/Interim Summary: [name] a 70 y.o.male,w Dementia, COPD, Hypertension, Stage 1 CKD, presents with sepsis from Health Care associated pneumonia.   Discharge Diagnoses:  Active Problems:   Hypotension   Acute renal failure (ARF) (HCC)   Hypokalemia   Sepsis (HCC)   Anemia   Fever   HCAP (healthcare-associated pneumonia)   Sepsis from right sided pneumonia/ health care associated pneumonia. patient came in febrile, tachycardic, hypotensive, leukocytosis, in acute renal failure.  Empirically started on IV vancomycin and IV levaquin , continue with levaquin to complete the course.  . Pt afebrile, tachycardia and hypotension resolved. Leukocytosis resolved.  Blood cultures done on 1/6 and negative till date.  UA is unremarkable , urine cultures negative till date.  C diff PCR negative for toxin and antigen and stool cultures are pending and negative so far. .  CT abdomen and pelvis ordered , showed right sided pneumonia.  Influenza PCR is also negative.     Hypotension: resolved.   COPD; no wheezing heard. No indication of steroids.   Acute renal failure:  Baseline creatinine is 1.2, admitted with creatinine of 1.4. Possibly from pre renal, dehydration. Resolved after IV fluids.    Dementia: no behavioral abnormalities.  Stable.   Hypokalemia: repleted.  Discharge Instructions   Allergies as of 11/22/2016      Reactions   Penicillins Hives      Medication List    TAKE these medications   albuterol (2.5 MG/3ML) 0.083%  nebulizer solution Commonly known as:  PROVENTIL Take 2.5 mg by nebulization every 4 (four) hours as needed for wheezing or shortness of breath.   aspirin 81 MG tablet Take 81 mg by mouth daily.   atenolol 25 MG tablet Commonly known as:  TENORMIN Take 12.5 mg by mouth daily.   bisacodyl 5 MG EC tablet Commonly known as:  DULCOLAX Take 10 mg by mouth daily as needed for mild constipation or moderate constipation.   cetirizine 10 MG tablet Commonly known as:  ZYRTEC Take 10 mg by mouth daily.   CHLORASEPTIC 6-10 MG lozenge Generic drug:  benzocaine-menthol Take 1 lozenge by mouth every 2 (two) hours as needed for sore throat.   donepezil 10 MG tablet Commonly known as:  ARICEPT Take 10 mg by mouth 2 (two) times daily.   fish oil-omega-3 fatty acids 1000 MG capsule Take 2 g by mouth daily.   glucosamine-chondroitin 500-400 MG tablet Take 1 tablet by mouth daily.   guaiFENesin 100 MG/5ML Soln Commonly known as:  ROBITUSSIN Take 10 mLs by mouth every 4 (four) hours as needed for cough or to loosen phlegm.   levofloxacin 750 MG tablet Commonly known as:  LEVAQUIN Take 1 tablet (750 mg total) by mouth daily. Start taking on:  11/24/2016   memantine 10 MG tablet Commonly known as:  NAMENDA Take 10 mg by mouth 2 (two) times daily.   mupirocin ointment 2 % Commonly known as:  BACTROBAN Place 1 application into the nose 2 (two) times daily.   perphenazine 4 MG tablet Commonly known as:  TRILAFON Take 4 mg by mouth at  bedtime.   pravastatin 20 MG tablet Commonly known as:  PRAVACHOL Take 20 mg by mouth daily.   QUEtiapine 25 MG tablet Commonly known as:  SEROQUEL Take 25 mg by mouth 3 (three) times daily.   thiamine 100 MG tablet Commonly known as:  VITAMIN B-1 Take 100 mg by mouth daily.   vitamin C 500 MG tablet Commonly known as:  ASCORBIC ACID Take 500 mg by mouth daily.   Vitamin D 2000 units Caps Take 1 capsule by mouth daily.   vitamin E 400 UNIT  capsule Take 400 Units by mouth daily.       Allergies  Allergen Reactions  . Penicillins Hives    Consultations:  None    Procedures/Studies: Ct Abdomen Pelvis Wo Contrast  Result Date: 11/20/2016 CLINICAL DATA:  Sepsis of unclear etiology: came in febrile, tachycardic, hypotensive, leukocytosis, in acute renal failure. Hypotension, COPD/ dementia. EXAM: CT ABDOMEN AND PELVIS WITHOUT CONTRAST TECHNIQUE: Multidetector CT imaging of the abdomen and pelvis was performed following the standard protocol without IV contrast. COMPARISON:  11/17/2013 FINDINGS: Lower chest: There is focal airspace consolidation in posterolateral right lower lobe abutting the pleural margin measuring approximately 4.8 x 2.1 x 3.5 cm. This is new when compared to the prior exam. Lung bases otherwise clear. Heart normal in size. Hepatobiliary: 3 small low-density liver lesions, largest centrally measuring 12 mm. Another subtle low-density lesion lies in the anterior aspect of the medial segment left lobe with a third in the lateral aspect of the posterior segment of the right lobe. These were present previously and are consistent with cysts. No other liver abnormality. There is dependent density in the gallbladder consistent with small stones. No evidence of acute cholecystitis. No bile duct dilation. Pancreas: Subtle haziness is noted in the fat adjacent to the pancreatic head and main portal vein. Pancreas otherwise unremarkable with no masses. Surgical vascular clips are noted adjacent to the pancreas and below the superior mesenteric artery, stable from the prior study. Spleen: Spleen is small, but stable.  No splenic masses. Adrenals/Urinary Tract: Left adrenal gland nodular thickening with milder thickening of the right adrenal gland, stable from the prior study. No renal masses or stones. No hydronephrosis. Normal ureters. Bladder is mostly decompressed but otherwise unremarkable. Stomach/Bowel: Stomach is within  normal limits. Appendix appears normal. No evidence of bowel wall thickening, distention, or inflammatory changes. Vascular/Lymphatic: There is diffuse aortic ectasia. There is a focal dilation of the infrarenal abdominal aorta just above the bifurcation measuring a maximum of 4.6 cm from anterior-posterior, previously 4.3 cm. There is focal dilation of the left common iliac artery at its bifurcation measuring 3.1 cm, previously 2.8 cm. Dilation of the right common iliac artery is noted to 2.3 cm previously 1.9 cm. There is diffuse atherosclerotic plaque along the abdominal aorta and iliac vessels. There are prominent to mildly enlarged gastrohepatic ligament lymph nodes, largest measuring 17 mm in short axis, without change from the prior CT. No other adenopathy. Reproductive: Unremarkable. Other: No significant additional abnormalities in the abdomen or pelvis. Musculoskeletal: No acute finding. No osteoblastic or osteolytic lesions. IMPRESSION: 1. Right lower lobe pneumonia.  This is likely the source of sepsis. 2. No convincing acute findings within the abdomen or pelvis. 3. Small liver cyst, relatively stable from the prior CT. 4. Cholelithiasis.  No acute cholecystitis. 5. 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 Infrarenal abdominal aortic aneurysm measures 4.6 cm. Recommend followup by abdomen and pelvis CTA in 6 months, and vascular surgery referral/consultation if not already obtained. This recommendation follows ACR consensus guidelines: White Paper of the ACR Incidental Findings Committee II on Vascular Findings. J Am Coll Radiol 2013; 10:789-794. 6. Mild reactive adenopathy along the gastrohepatic ligament stable from the prior CT. Nodular enlargement of the adrenal glands also stable from the prior CT. Electronically Signed   By: Amie Portlandavid  Ormond M.D.   On: 11/20/2016 17:21    Dg Chest Portable 1 View  Result Date: 11/19/2016 CLINICAL DATA:  Fever up to 103.5 EXAM: PORTABLE CHEST 1 VIEW COMPARISON:  10/31/2016 FINDINGS: No acute infiltrate or effusion. Stable cardiomediastinal silhouette with tortuous aorta. No pneumothorax. IMPRESSION: No acute infiltrate or edema Electronically Signed   By: Jasmine PangKim  Fujinaga M.D.   On: 11/19/2016 22:28      Subjective: No new complaints.   Discharge Exam: Vitals:   11/21/16 2300 11/22/16 0640  BP: 133/66 120/66  Pulse: 90 75  Resp: 20 20  Temp: 99.1 F (37.3 C) 99.3 F (37.4 C)   Vitals:   11/21/16 1300 11/21/16 1400 11/21/16 2300 11/22/16 0640  BP: (!) 146/123 (!) 131/56 133/66 120/66  Pulse: 85 (!) 117 90 75  Resp: (!) 25 (!) 21 20 20   Temp:   99.1 F (37.3 C) 99.3 F (37.4 C)  TempSrc:   Oral Oral  SpO2: 94% (!) 87% 97% 98%  Weight:    (!) 175.6 kg (387 lb 3.2 oz)  Height:        General: Pt is alert, awake, not in acute distress Cardiovascular: RRR, S1/S2 +, no rubs, no gallops Respiratory: CTA bilaterally, no wheezing, no rhonchi Abdominal: Soft, NT, ND, bowel sounds + Extremities: no edema, no cyanosis    The results of significant diagnostics from this hospitalization (including imaging, microbiology, ancillary and laboratory) are listed below for reference.     Microbiology: Recent Results (from the past 240 hour(s))  Blood culture (routine x 2)     Status: None (Preliminary result)   Collection Time: 11/19/16 10:44 PM  Result Value Ref Range Status   Specimen Description RIGHT ANTECUBITAL  Final   Special Requests BOTTLES DRAWN AEROBIC AND ANAEROBIC 6CC EACH  Final   Culture NO GROWTH 3 DAYS  Final   Report Status PENDING  Incomplete  Blood culture (routine x 2)     Status: None (Preliminary result)   Collection Time: 11/19/16 10:53 PM  Result Value Ref Range Status   Specimen Description RIGHT ANTECUBITAL  Final   Special Requests BOTTLES DRAWN AEROBIC AND ANAEROBIC 6CC EACH  Final    Culture NO GROWTH 3 DAYS  Final   Report Status PENDING  Incomplete  Urine culture     Status: None   Collection Time: 11/20/16  1:11 AM  Result Value Ref Range Status   Specimen Description URINE, CATHETERIZED  Final   Special Requests NONE  Final   Culture NO GROWTH [facility]   Final   Report Status 11/21/2016 FINAL  Final  MRSA PCR Screening     Status: Abnormal   Collection Time: 11/20/16  3:13 AM  Result Value Ref Range Status   MRSA by PCR POSITIVE (A) NEGATIVE Final    Comment:        The GeneXpert MRSA Assay (FDA approved for NASAL specimens only), is one component of a comprehensive MRSA colonization surveillance program. It is not intended  to diagnose MRSA infection nor to guide or monitor treatment for MRSA infections. RESULT CALLED TO, READ BACK BY AND VERIFIED WITH: PHILLIPS,C AT 1056 ON 11/14/2016 BY MOSLEY,J   C difficile quick scan w PCR reflex     Status: None   Collection Time: 11/20/16  9:53 AM  Result Value Ref Range Status   C Diff antigen NEGATIVE NEGATIVE Final   C Diff toxin NEGATIVE NEGATIVE Final   C Diff interpretation No C. difficile detected.  Final  Stool culture (children & immunocomp patients)     Status: None (Preliminary result)   Collection Time: 11/20/16  9:53 AM  Result Value Ref Range Status   Salmonella/Shigella Screen PENDING  Incomplete   Campylobacter Culture PENDING  Incomplete   E coli, Shiga toxin Assay Negative Negative Final    Comment: (NOTE) Performed At: [facility] [address] [place], [place] [id number] Mila Homer MD WU:[id number]      Labs: BNP (last 3 results) No results for input(s): BNP in the last 8760 hours. Basic Metabolic Panel:  Recent Labs Lab 11/19/16 2244 11/20/16 0426 11/21/16 1610 11/22/16 0555  NA 134* 139 137 136  K 3.3* 3.7 3.5 3.1*  CL 104 108 106 106  CO2 21* 24 24 24   GLUCOSE 132* 110* 130* 99  BUN 26* 23* 16 13  CREATININE 1.89* 1.46*  1.46*  1.08 0.85  CALCIUM 8.4* 8.3* 8.6* 8.6*   Liver Function Tests:  Recent Labs Lab 11/19/16 2244 11/20/16 0426  AST 46* 41  ALT 40 38  ALKPHOS 53 57  BILITOT 0.9 0.9  PROT 5.9* 6.4*  ALBUMIN 2.9* 3.0*   No results for input(s): LIPASE, AMYLASE in the last 168 hours. No results for input(s): AMMONIA in the last 168 hours. CBC:  Recent Labs Lab 11/19/16 2244 11/20/16 0426 11/22/16 0555  WBC 18.3* 21.8* 14.5*  NEUTROABS 13.6*  --   --   HGB 12.9* 12.9* 11.8*  HCT 38.9* 39.1 34.2*  MCV 98.0 98.7 95.3  PLT 435* 445* 485*   Cardiac Enzymes:  Recent Labs Lab 11/20/16 0426 11/20/16 0857 11/20/16 1513  TROPONINI 0.03* <0.03 <0.03   BNP: Invalid input(s): POCBNP CBG: No results for input(s): GLUCAP in the last 168 hours. D-Dimer No results for input(s): DDIMER in the last 72 hours. Hgb A1c No results for input(s): HGBA1C in the last 72 hours. Lipid Profile No results for input(s): CHOL, HDL, LDLCALC, TRIG, CHOLHDL, LDLDIRECT in the last 72 hours. Thyroid function studies No results for input(s): TSH, T4TOTAL, T3FREE, THYROIDAB in the last 72 hours.  Invalid input(s): FREET3 Anemia work up No results for input(s): VITAMINB12, FOLATE, FERRITIN, TIBC, IRON, RETICCTPCT in the last 72 hours. Urinalysis    Component Value Date/Time   COLORURINE AMBER (A) 11/20/2016 0111   APPEARANCEUR HAZY (A) 11/20/2016 0111   LABSPEC 1.025 11/20/2016 0111   PHURINE 5.5 11/20/2016 0111   GLUCOSEU NEGATIVE 11/20/2016 0111   HGBUR NEGATIVE 11/20/2016 0111   BILIRUBINUR NEGATIVE 11/20/2016 0111   KETONESUR TRACE (A) 11/20/2016 0111   PROTEINUR 100 (A) 11/20/2016 0111   UROBILINOGEN 0.2 11/17/2013 0345   NITRITE NEGATIVE 11/20/2016 0111   LEUKOCYTESUR NEGATIVE 11/20/2016 0111   Sepsis Labs Invalid input(s): PROCALCITONIN,  WBC,  LACTICIDVEN Microbiology Recent Results (from the past 240 hour(s))  Blood culture (routine x 2)     Status: None (Preliminary result)   Collection  Time: 11/19/16 10:44 PM  Result Value Ref Range Status   Specimen Description RIGHT ANTECUBITAL  Final   Special Requests BOTTLES DRAWN AEROBIC AND ANAEROBIC 6CC EACH  Final   Culture NO GROWTH 3 DAYS  Final   Report Status PENDING  Incomplete  Blood culture (routine x 2)     Status: None (Preliminary result)   Collection Time: 11/19/16 10:53 PM  Result Value Ref Range Status   Specimen Description RIGHT ANTECUBITAL  Final   Special Requests BOTTLES DRAWN AEROBIC AND ANAEROBIC 6CC EACH  Final   Culture NO GROWTH 3 DAYS  Final   Report Status PENDING  Incomplete  Urine culture     Status: None   Collection Time: 11/20/16  1:11 AM  Result Value Ref Range Status   Specimen Description URINE, CATHETERIZED  Final   Special Requests NONE  Final   Culture NO GROWTH Performed at [facility]   Final   Report Status 11/21/2016 FINAL  Final  MRSA PCR Screening     Status: Abnormal   Collection Time: 11/20/16  3:13 AM  Result Value Ref Range Status   MRSA by PCR POSITIVE (A) NEGATIVE Final    Comment:        The GeneXpert MRSA Assay (FDA approved for NASAL specimens only), is one component of a comprehensive MRSA colonization surveillance program. It is not intended to diagnose MRSA infection nor to guide or monitor treatment for MRSA infections. RESULT CALLED TO, READ BACK BY AND VERIFIED WITH: PHILLIPS,C AT 1056 ON 11/14/2016 BY MOSLEY,J   C difficile quick scan w PCR reflex     Status: None   Collection Time: 11/20/16  9:53 AM  Result Value Ref Range Status   C Diff antigen NEGATIVE NEGATIVE Final   C Diff toxin NEGATIVE NEGATIVE Final   C Diff interpretation No C. difficile detected.  Final  Stool culture (children & immunocomp patients)     Status: None (Preliminary result)   Collection Time: 11/20/16  9:53 AM  Result Value Ref Range Status   Salmonella/Shigella Screen PENDING  Incomplete   Campylobacter Culture PENDING  Incomplete   E coli, Shiga toxin Assay Negative  Negative Final    Comment: (NOTE) Performed At: [facility] [address] [place], [place] [id number] Mila Homer MD LE:[id number]      Time coordinating discharge: Over 30 minutes  SIGNED:   Kathlen Mody, MD  [facility] 11/22/2016, 1:02 PM Pager   If 7PM-7AM, please contact night-coverage [email or web address] Password [email or web address]

## 2016-11-22 NOTE — Progress Notes (Signed)
PROGRESS NOTE    Robert Wilson  ZOX:[id number] DOB: 04-Oct-1946 DOA: 11/19/2016 PCP: Pcp Not In System    Brief Narrative: Robert Wilson  is a 71 y.o. male, w Dementia, COPD, Hypertension, Stage 1 CKD, presents with sepsis from Health Care associated pneumonia.   Assessment & Plan:   Active Problems:   Hypotension   Acute renal failure (ARF) (HCC)   Hypokalemia   Sepsis (HCC)   Anemia   Fever   Sepsis from right sided pneumonia/ health care associated pneumonia. patient came in febrile, tachycardic, hypotensive, leukocytosis, in acute renal failure.  Empirically started on IV vancomycin and IV levaquin , resume the same, . Pt afebrile, tachycardia and hypotension resolved. Leukocytosis resolved.  Blood cultures done on 1/6 and negative till date.  UA is unremarkable , urine cultures negative till date.  C diff PCR negative for toxin and antigen and stool cultures are pending and negative so far. .  CT abdomen and pelvis ordered , showed right sided pneumonia.  Influenza PCR is also negative.     Hypotension: resolved.   COPD; no wheezing heard. No indication of steroids.   Acute renal failure:  Baseline creatinine is 1.2, admitted with creatinine of 1.4. Possibly from pre renal, dehydration. Resolved after IV fluids.    Dementia: no behavioral abnormalities.  Stable.   Hypokalemia: repleted > repeat in am.      DVT prophylaxis: heparin SQ Code Status: (Full ) Family Communication: none at bedside.  Disposition Plan: back to SNF in am.   Consultants:   none   Procedures: none   Antimicrobials: vancomycin 1/6, and levaquin 1/6.    Subjective: Denies any complaints, reports feeling better.   Objective: Vitals:   11/21/16 1300 11/21/16 1400 11/21/16 2300 11/22/16 0640  BP: (!) 146/123 (!) 131/56 133/66 120/66  Pulse: 85 (!) 117 90 75  Resp: (!) 25 (!) 21 20 20   Temp:   99.1 F (37.3 C) 99.3 F (37.4 C)  TempSrc:   Oral Oral  SpO2: 94% (!)  87% 97% 98%  Weight:    (!) 175.6 kg (387 lb 3.2 oz)  Height:        Intake/Output Summary (Last 24 hours) at 11/22/16 1254 Last data filed at 11/22/16 0900  Gross per 24 hour  Intake          1483.33 ml  Output              200 ml  Net          1283.33 ml   Filed Weights   11/20/16 0316 11/21/16 0500 11/22/16 0640  Weight: 124.9 kg (275 lb 5.7 oz) 126.9 kg (279 lb 12.2 oz) (!) 175.6 kg (387 lb 3.2 oz)    Examination:  General exam: Appears calm and comfortable  Respiratory system: Clear to auscultation. Respiratory effort normal. Cardiovascular system: S1 & S2 heard, RRR. No JVD, murmurs, rubs, gallops or clicks. Gastrointestinal system: Abdomen is nondistended, soft and nontender. No organomegaly or masses felt. Normal bowel sounds heard. Central nervous system:sleeping, able to answer simple questions, able to move his extremities. Speech normal. Extremities: no pedal edema.  Skin: No rashes, lesions or ulcers     Data Reviewed: I have personally reviewed following labs and imaging studies  CBC:  Recent Labs Lab 11/19/16 2244 11/20/16 0426 11/22/16 0555  WBC 18.3* 21.8* 14.5*  NEUTROABS 13.6*  --   --   HGB 12.9* 12.9* 11.8*  HCT 38.9* 39.1 34.2*  MCV 98.0  98.7 95.3  PLT 435* 445* 485*   Basic Metabolic Panel:  Recent Labs Lab 11/19/16 2244 11/20/16 0426 11/21/16 1610 11/22/16 0555  NA 134* 139 137 136  K 3.3* 3.7 3.5 3.1*  CL 104 108 106 106  CO2 21* 24 24 24   GLUCOSE 132* 110* 130* 99  BUN 26* 23* 16 13  CREATININE 1.89* 1.46*  1.46* 1.08 0.85  CALCIUM 8.4* 8.3* 8.6* 8.6*   GFR: Estimated Creatinine Clearance: 143.9 mL/min (by C-G formula based on SCr of 0.85 mg/dL). Liver Function Tests:  Recent Labs Lab 11/19/16 2244 11/20/16 0426  AST 46* 41  ALT 40 38  ALKPHOS 53 57  BILITOT 0.9 0.9  PROT 5.9* 6.4*  ALBUMIN 2.9* 3.0*   No results for input(s): LIPASE, AMYLASE in the last 168 hours. No results for input(s): AMMONIA in the last  168 hours. Coagulation Profile: No results for input(s): INR, PROTIME in the last 168 hours. Cardiac Enzymes:  Recent Labs Lab 11/20/16 0426 11/20/16 0857 11/20/16 1513  TROPONINI 0.03* <0.03 <0.03   BNP (last 3 results) No results for input(s): PROBNP in the last 8760 hours. HbA1C: No results for input(s): HGBA1C in the last 72 hours. CBG: No results for input(s): GLUCAP in the last 168 hours. Lipid Profile: No results for input(s): CHOL, HDL, LDLCALC, TRIG, CHOLHDL, LDLDIRECT in the last 72 hours. Thyroid Function Tests: No results for input(s): TSH, T4TOTAL, FREET4, T3FREE, THYROIDAB in the last 72 hours. Anemia Panel: No results for input(s): VITAMINB12, FOLATE, FERRITIN, TIBC, IRON, RETICCTPCT in the last 72 hours. Sepsis Labs:  Recent Labs Lab 11/19/16 2244 11/20/16 0131  LATICACIDVEN 1.1 1.1    Recent Results (from the past 240 hour(s))  Blood culture (routine x 2)     Status: None (Preliminary result)   Collection Time: 11/19/16 10:44 PM  Result Value Ref Range Status   Specimen Description RIGHT ANTECUBITAL  Final   Special Requests BOTTLES DRAWN AEROBIC AND ANAEROBIC 6CC EACH  Final   Culture NO GROWTH 3 DAYS  Final   Report Status PENDING  Incomplete  Blood culture (routine x 2)     Status: None (Preliminary result)   Collection Time: 11/19/16 10:53 PM  Result Value Ref Range Status   Specimen Description RIGHT ANTECUBITAL  Final   Special Requests BOTTLES DRAWN AEROBIC AND ANAEROBIC 6CC EACH  Final   Culture NO GROWTH 3 DAYS  Final   Report Status PENDING  Incomplete  Urine culture     Status: None   Collection Time: 11/20/16  1:11 AM  Result Value Ref Range Status   Specimen Description URINE, CATHETERIZED  Final   Special Requests NONE  Final   Culture NO GROWTH Performed at [facility]   Final   Report Status 11/21/2016 FINAL  Final  MRSA PCR Screening     Status: Abnormal   Collection Time: 11/20/16  3:13 AM  Result Value Ref Range  Status   MRSA by PCR POSITIVE (A) NEGATIVE Final    Comment:        The GeneXpert MRSA Assay (FDA approved for NASAL specimens only), is one component of a comprehensive MRSA colonization surveillance program. It is not intended to diagnose MRSA infection nor to guide or monitor treatment for MRSA infections. RESULT CALLED TO, READ BACK BY AND VERIFIED WITH: PHILLIPS,C AT 1056 ON 11/14/2016 BY MOSLEY,J   C difficile quick scan w PCR reflex     Status: None   Collection Time: 11/20/16  9:53  AM  Result Value Ref Range Status   C Diff antigen NEGATIVE NEGATIVE Final   C Diff toxin NEGATIVE NEGATIVE Final   C Diff interpretation No C. difficile detected.  Final  Stool culture (children & immunocomp patients)     Status: None (Preliminary result)   Collection Time: 11/20/16  9:53 AM  Result Value Ref Range Status   Salmonella/Shigella Screen PENDING  Incomplete   Campylobacter Culture PENDING  Incomplete   E coli, Shiga toxin Assay Negative Negative Final    Comment: (NOTE) Performed At: [facility] [address] [place], [place] [id number] Mila Homer MD WU:[id number]          Radiology Studies: Ct Abdomen Pelvis Wo Contrast  Result Date: 11/20/2016 CLINICAL DATA:  Sepsis of unclear etiology: came in febrile, tachycardic, hypotensive, leukocytosis, in acute renal failure. Hypotension, COPD/ dementia. EXAM: CT ABDOMEN AND PELVIS WITHOUT CONTRAST TECHNIQUE: Multidetector CT imaging of the abdomen and pelvis was performed following the standard protocol without IV contrast. COMPARISON:  11/17/2013 FINDINGS: Lower chest: There is focal airspace consolidation in posterolateral right lower lobe abutting the pleural margin measuring approximately 4.8 x 2.1 x 3.5 cm. This is new when compared to the prior exam. Lung bases otherwise clear. Heart normal in size. Hepatobiliary: 3 small low-density liver lesions, largest centrally measuring 12 mm. Another subtle low-density  lesion lies in the anterior aspect of the medial segment left lobe with a third in the lateral aspect of the posterior segment of the right lobe. These were present previously and are consistent with cysts. No other liver abnormality. There is dependent density in the gallbladder consistent with small stones. No evidence of acute cholecystitis. No bile duct dilation. Pancreas: Subtle haziness is noted in the fat adjacent to the pancreatic head and main portal vein. Pancreas otherwise unremarkable with no masses. Surgical vascular clips are noted adjacent to the pancreas and below the superior mesenteric artery, stable from the prior study. Spleen: Spleen is small, but stable.  No splenic masses. Adrenals/Urinary Tract: Left adrenal gland nodular thickening with milder thickening of the right adrenal gland, stable from the prior study. No renal masses or stones. No hydronephrosis. Normal ureters. Bladder is mostly decompressed but otherwise unremarkable. Stomach/Bowel: Stomach is within normal limits. Appendix appears normal. No evidence of bowel wall thickening, distention, or inflammatory changes. Vascular/Lymphatic: There is diffuse aortic ectasia. There is a focal dilation of the infrarenal abdominal aorta just above the bifurcation measuring a maximum of 4.6 cm from anterior-posterior, previously 4.3 cm. There is focal dilation of the left common iliac artery at its bifurcation measuring 3.1 cm, previously 2.8 cm. Dilation of the right common iliac artery is noted to 2.3 cm previously 1.9 cm. There is diffuse atherosclerotic plaque along the abdominal aorta and iliac vessels. There are prominent to mildly enlarged gastrohepatic ligament lymph nodes, largest measuring 17 mm in short axis, without change from the prior CT. No other adenopathy. Reproductive: Unremarkable. Other: No significant additional abnormalities in the abdomen or pelvis. Musculoskeletal: No acute finding. No osteoblastic or osteolytic  lesions. IMPRESSION: 1. Right lower lobe pneumonia.  This is likely the source of sepsis. 2. No convincing acute findings within the abdomen or pelvis. 3. Small liver cyst, relatively stable from the prior CT. 4. Cholelithiasis.  No acute cholecystitis. 5. 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 Infrarenal abdominal aortic aneurysm measures  4.6 cm. Recommend followup by abdomen and pelvis CTA in 6 months, and vascular surgery referral/consultation if not already obtained. This recommendation follows ACR consensus guidelines: White Paper of the ACR Incidental Findings Committee II on Vascular Findings. J Am Coll Radiol 2013; 10:789-794. 6. Mild reactive adenopathy along the gastrohepatic ligament stable from the prior CT. Nodular enlargement of the adrenal glands also stable from the prior CT. Electronically Signed   By: Amie Portland M.D.   On: 11/20/2016 17:21        Scheduled Meds: . aspirin EC  81 mg Oral Daily  . Chlorhexidine Gluconate Cloth  6 each Topical Q0600  . donepezil  10 mg Oral BID  . heparin  5,000 Units Subcutaneous Q8H  . levofloxacin (LEVAQUIN) IV  750 mg Intravenous Q24H  . memantine  10 mg Oral BID  . mupirocin ointment  1 application Nasal BID  . potassium chloride  40 mEq Oral BID  . pravastatin  20 mg Oral Daily  . thiamine  100 mg Oral Daily  . vancomycin  1,000 mg Intravenous Q12H   Continuous Infusions: . sodium chloride 1,000 mL (11/21/16 1244)     LOS: 2 days    Time spent: 25 min    Cymone Yeske, MD [facility] Pager [phone]  If 7PM-7AM, please contact night-coverage [email or web address] Password [email or web address] 11/22/2016, 12:54 PM

## 2016-11-22 NOTE — Evaluation (Signed)
Physical Therapy Evaluation Patient Details Name: Jazmin Vensel MRN: [id number] DOB: 1946/03/08 Today's Date: 11/22/2016   History of Present Illness  71 y.o.male,w Dementia, COPD, Hypertension, Stage 1 CKD, presents with sepsis FROM HEALTH care associated pneumonia.   Clinical Impression  Pt received in bed, and was marginally agreeable to PT evaluation.  Pt is not a reliable historian due to dementia.  He states that he does not normally use any type of assistive device for ambulation, and he is independent with ADL's, however he was admitted from [facility] where he was likely receiving assistance for all ADL's.  During PT evaluation he was lethargic, but willing to work.  He required supervision for supine<>sit, but Mod A for sit<>stand with RW.  During gait it was noted that he was not placing much weight through the R LE, and he expressed that he was having a lot of R knee pain for the past few days since he fell.  He was able to ambulate 72ft with RW and Min A, but further distance was limited due to increasing R knee pain.  RN notified as no imaging for his R knee is currently in the chart.  Continue to recommend that he return to SNF for further rehab to improve strengthening and functional mobility.     Follow Up Recommendations SNF    Equipment Recommendations  None recommended by PT    Recommendations for Other Services       Precautions / Restrictions Precautions Precautions: Fall Precaution Comments: due to immobiltiy and after questioning, pt expressed that he had a fall Restrictions Weight Bearing Restrictions: No      Mobility  Bed Mobility Overal bed mobility: Needs Assistance Bed Mobility: Supine to Sit     Supine to sit: Supervision        Transfers Overall transfer level: Needs assistance Equipment used: Rolling walker (2 wheeled) Transfers: Sit to/from Stand Sit to Stand: Mod assist         General transfer comment: Assistance for power up  into standing.    Ambulation/Gait Ambulation/Gait assistance: Min assist Ambulation Distance (Feet): 30 Feet Assistive device: Rolling walker (2 wheeled) Gait Pattern/deviations: Step-to pattern;Antalgic;Decreased weight shift to right     General Gait Details: Pt holds R knee flexed, and does not place much weight through his R LE.  Pt c/o R knee pain, and states it started a few days ago when he fell.  No imagaing available.  RN notified.   Stairs            Wheelchair Mobility    Modified Rankin (Stroke Patients Only)       Balance Overall balance assessment: Needs assistance;History of Falls Sitting-balance support: Feet supported;Bilateral upper extremity supported Sitting balance-Leahy Scale: Good     Standing balance support: Bilateral upper extremity supported Standing balance-Leahy Scale: Poor                               Pertinent Vitals/Pain Pain Assessment: Faces Faces Pain Scale: Hurts even more Pain Location: R knee - pt not weight bearing much through R knee during gait, and expressed it was painful.  Pain Intervention(s): Limited activity within patient's tolerance;Monitored during session;Repositioned    Home Living                        Prior Function     Gait / Transfers Assistance Needed: Pt states he  normally does not use any DME for ambulation  ADL's / Homemaking Assistance Needed: Pt reports taht he is normally independent with dressing and bathing.         Hand Dominance   Dominant Hand: Right    Extremity/Trunk Assessment   Upper Extremity Assessment Upper Extremity Assessment: Generalized weakness    Lower Extremity Assessment Lower Extremity Assessment: Generalized weakness       Communication      Cognition Arousal/Alertness: Lethargic Behavior During Therapy: Flat affect Overall Cognitive Status: History of cognitive impairments - at baseline Area of Impairment: Orientation Orientation  Level: Place;Time;Situation;Disoriented to                  General Comments      Exercises     Assessment/Plan    PT Assessment Patient needs continued PT services  PT Problem List Decreased strength;Decreased activity tolerance;Decreased balance;Decreased mobility;Decreased knowledge of use of DME;Decreased coordination;Decreased knowledge of precautions;Decreased safety awareness;Pain          PT Treatment Interventions DME instruction;Gait training;Functional mobility training;Therapeutic activities;Therapeutic exercise;Balance training;Neuromuscular re-education;Patient/family education    PT Goals (Current goals can be found in the Care Plan section)  Acute Rehab PT Goals Patient Stated Goal: None stated Time For Goal Achievement: 11/29/16 Potential to Achieve Goals: Fair    Frequency Min 3X/week   Barriers to discharge        Co-evaluation               End of Session Equipment Utilized During Treatment: Gait belt Activity Tolerance: Patient limited by pain Patient left: in bed;with call bell/phone within reach;with bed alarm set Nurse Communication: Mobility status Alona Bene(Joyce, RN notified of pain in pt's R knee, and mobiltiy status.  Mobility sheet left up in the room. )    Functional Assessment Tool Used: [employer] AM-PAC "6-clicks"  Functional Limitation: Mobility: Walking and moving around Mobility: Walking and Moving Around Current Status [phone](G8978): At least 40 percent but less than 60 percent impaired, limited or restricted Mobility: Walking and Moving Around Goal Status [phone](G8979): At least 20 percent but less than 40 percent impaired, limited or restricted    Time: 1046-1110 PT Time Calculation (min) (ACUTE ONLY): 24 min   Charges:   PT Evaluation $PT Eval Low Complexity: 1 Procedure PT Treatments $Gait Training: 8-22 mins   PT G Codes:   PT G-Codes **NOT FOR INPATIENT CLASS** Functional Assessment Tool Used: [employer] AM-PAC  "6-clicks"  Functional Limitation: Mobility: Walking and moving around Mobility: Walking and Moving Around Current Status [phone](G8978): At least 40 percent but less than 60 percent impaired, limited or restricted Mobility: Walking and Moving Around Goal Status [phone](G8979): At least 20 percent but less than 40 percent impaired, limited or restricted    Beth Fabiana Dromgoole, PT, DPT X: [phone]

## 2016-11-23 DIAGNOSIS — N179 Acute kidney failure, unspecified: Secondary | ICD-10-CM

## 2016-11-23 NOTE — Progress Notes (Signed)
Report called to [facility] Staff member Danesha LPN.  All questions answered.

## 2016-11-23 NOTE — Clinical Social Work Note (Signed)
Pt d/c today back to [facility]. [facility] to start authorization. Five day LOG approved by supervisor. Discussed letter of guarantee with wife who reports understanding. Pt's wife arranging transport via [facility].   Dawn Convery, LCSW [phone]

## 2016-11-23 NOTE — NC FL2 (Signed)
MEDICAID FL2 LEVEL OF CARE SCREENING TOOL     IDENTIFICATION  Patient Name: Robert Wilson Birthdate: 11-25-1945 Sex: male Admission Date (Current Location): 11/19/2016  [facility] and [place] Number:  [employer] and Address:  [facility],  [address], [place] [address]      Provider Number: [id number]  Attending Physician Name and Address:  Micael Hampshire Acost*  Relative Name and Phone Number:       Current Level of Care: Hospital Recommended Level of Care: Skilled Nursing Facility Prior Approval Number:    Date Approved/Denied:   PASRR Number: [id number]   Discharge Plan: SNF    Current Diagnoses: Patient Active Problem List   Diagnosis Date Noted  . HCAP (healthcare-associated pneumonia) 11/22/2016  . Hypotension 11/20/2016  . Acute renal failure (ARF) (HCC) 11/20/2016  . Hypokalemia 11/20/2016  . Sepsis (HCC) 11/20/2016  . Anemia 11/20/2016  . Fever   . Enterocolitis 11/17/2013  . Enteritis 11/17/2013  . Dementia with behavioral disturbance 11/17/2013  . HTN (hypertension) 11/17/2013    Orientation RESPIRATION BLADDER Height & Weight     Self  Normal Continent Weight: 281 lb 9.6 oz (127.7 kg) Height:  6\' 4"  (193 cm)  BEHAVIORAL SYMPTOMS/MOOD NEUROLOGICAL BOWEL NUTRITION STATUS  Other (Comment) (none)  (n/a) Incontinent Diet (Heart healthy)  AMBULATORY STATUS COMMUNICATION OF NEEDS Skin   Limited Assist Verbally Normal                       Personal Care Assistance Level of Assistance  Bathing, Feeding, Dressing Bathing Assistance: Limited assistance Feeding assistance: Limited assistance Dressing Assistance: Limited assistance     Functional Limitations Info  Sight, Hearing, Speech Sight Info: Adequate Hearing Info: Adequate Speech Info: Adequate    SPECIAL CARE FACTORS FREQUENCY  PT (By licensed PT)                    Contractures Contractures Info: Not present    Additional  Factors Info  Code Status, Allergies, Isolation Precautions Code Status Info: Full code Allergies Info: Penicillins     Isolation Precautions Info: 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 acetaminophen (TYLENOL) tablet 650 mg  650 mg Oral Q6H PRN Pearson Grippe, MD   650 mg at 11/21/16 0530   Or  . acetaminophen (TYLENOL) suppository 650 mg  650 mg Rectal Q6H PRN Pearson Grippe, MD      . albuterol (PROVENTIL) (2.5 MG/3ML) 0.083% nebulizer solution 2.5 mg  2.5 mg Nebulization Q4H PRN Pearson Grippe, MD      . aspirin EC tablet 81 mg  81 mg Oral Daily Pearson Grippe, MD   81 mg at 11/22/16 1013  . Chlorhexidine Gluconate Cloth 2 % PADS 6 each  6 each Topical Q0600 Kathlen Mody, MD   6 each at 11/23/16 0606  . donepezil (ARICEPT) tablet 10 mg  10 mg Oral BID Pearson Grippe, MD   10 mg at 11/22/16 2122  . heparin injection 5,000 Units  5,000 Units Subcutaneous Q8H Pearson Grippe, MD   5,000 Units at 11/23/16 0606  . levofloxacin (LEVAQUIN) IVPB 750 mg  750 mg Intravenous Q24H Kathlen Mody, MD   750 mg at 11/23/16 0149  . memantine (NAMENDA) tablet 10 mg  10 mg Oral BID Pearson Grippe,  MD   10 mg at 11/22/16 2122  . mupirocin ointment (BACTROBAN) 2 % 1 application  1 application Nasal BID Kathlen ModyVijaya Akula, MD   1 application at 11/22/16 2122  . potassium chloride (KLOR-CON) packet 40 mEq  40 mEq Oral BID Kathlen ModyVijaya Akula, MD   40 mEq at 11/22/16 2122  . pravastatin (PRAVACHOL) tablet 20 mg  20 mg Oral Daily Pearson GrippeJames Kim, MD   20 mg at 11/22/16 1012  . thiamine (VITAMIN B-1) tablet 100 mg  100 mg Oral Daily Pearson GrippeJames Kim, MD   100 mg at 11/22/16 1012  . vancomycin (VANCOCIN) IVPB 1000 mg/200 mL premix  1,000 mg Intravenous Q12H Kathlen ModyVijaya Akula, MD   1,000 mg at 11/23/16 0606     Discharge Medications: Please see discharge summary for a list of discharge  medications.  Relevant Imaging Results:  Relevant Lab Results:   Additional Information    Karn CassisStultz, Osie Merkin [place], [place] [phone]

## 2016-11-23 NOTE — Progress Notes (Signed)
SLP Cancellation Note  Patient Details Name: Laurette SchimkeRussell Edward Treanor MRN: [id number] DOB: 1946/04/30   Cancelled treatment:        Session cancelled due to pt refusing to participate in session and to complete po trials. Pt seemed quite agitated during treatment attempt - nsg aware.  Also,  noted electrodes were not fully connected to pt chest area (nsg made aware). Case management states that the plan is for pt to return to  [facility] today. Pt to remain on ST caseload in the event that plans change, in order to attempt ST treatment for diet tolerance.    Thank you,   Danella Maiersshley M. Orvan Falconerampbell, MS, CCC-SLP  Speech-Language Pathologist [phone]      Addison Naegelishley Ryzen Deady 11/23/2016, 11:07 AM

## 2016-11-23 NOTE — Progress Notes (Signed)
Patient briefly seen and examined. Discharge paperwork completed 1/9 by Dr. Blake DivineAkula. OK to DC to SNF today.  Peggye PittEstela Hernandez, MD [facility] Pager: [phone]

## 2016-11-23 NOTE — Clinical Social Work Note (Signed)
[facility]   Derenda FennelKara Rashaud Ybarbo, LCSW [phone]

## 2016-11-24 LAB — STOOL CULTURE REFLEX - RSASHR

## 2016-11-24 LAB — STOOL CULTURE: E coli, Shiga toxin Assay: NEGATIVE

## 2016-11-24 LAB — STOOL CULTURE REFLEX - CMPCXR

## 2016-11-24 LAB — CULTURE, BLOOD (ROUTINE X 2)
CULTURE: NO GROWTH
Culture: NO GROWTH

## 2016-11-25 LAB — LACTOFERRIN, FECAL, QUALITATIVE: Lactoferrin, Fecal, Qual: 10.25 ug/mL(g) — ABNORMAL HIGH (ref 0.00–7.24)

## 2018-04-25 IMAGING — CR DG CHEST 1V PORT
1 series · 1 of 1 positions shown · non-contrast
Comparison: 10/31/2016

CLINICAL DATA: Fever up to

EXAM:
PORTABLE CHEST 1 VIEW

[portable]
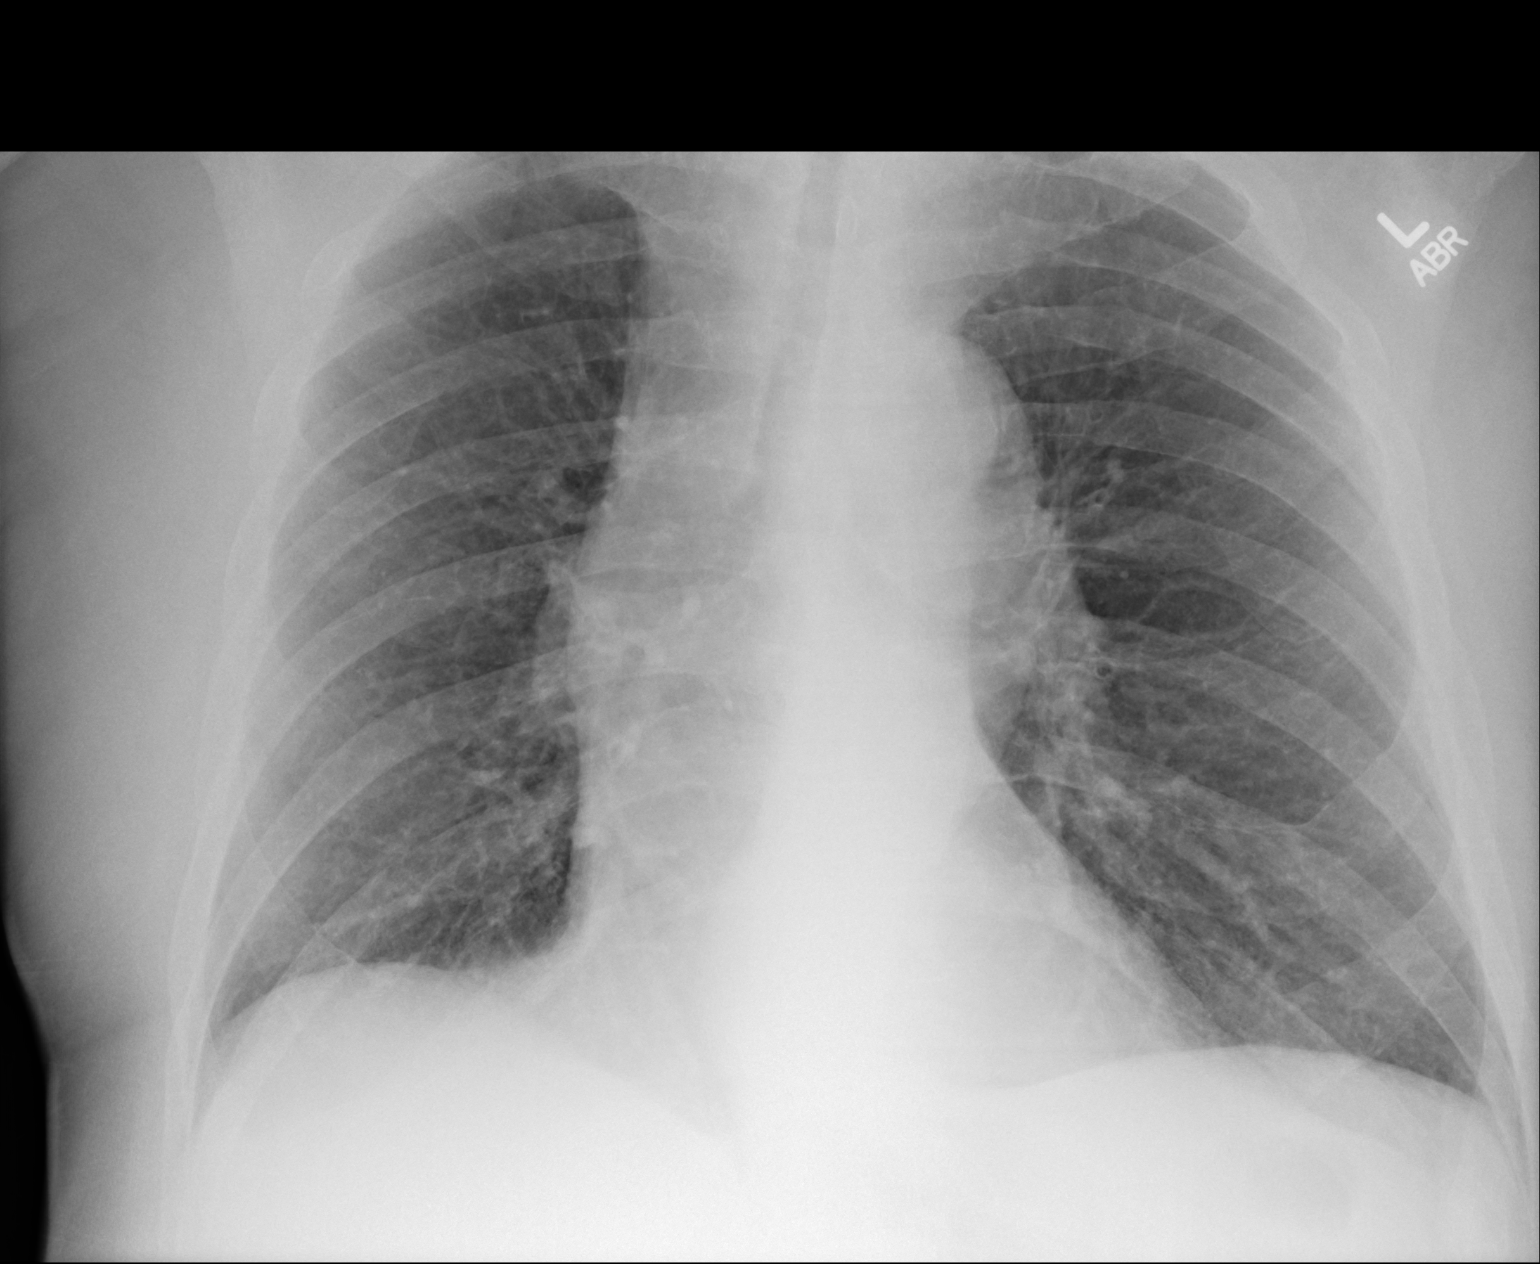

[1 of 1 positions shown; findings below may reference images not displayed]

FINDINGS: No acute infiltrate or effusion. Stable cardiomediastinal silhouette
with tortuous aorta. No pneumothorax.
IMPRESSION: No acute infiltrate or edema

## 2018-04-26 IMAGING — CT CT ABD-PELV W/O CM
2 of 4 series · 15 of 46 positions shown, 17 images · non-contrast
Comparison: 11/17/2013

CLINICAL DATA: Sepsis of unclear etiology: came in febrile,
tachycardic, hypotensive, leukocytosis, in acute renal failure.
Hypotension, COPD/ dementia.

EXAM:
CT ABDOMEN AND PELVIS WITHOUT CONTRAST
TECHNIQUE: Multidetector CT imaging of the abdomen and pelvis was performed
following the standard protocol without IV contrast.

[Series 2: axial st · axial · 0.92mm/px · z∈[-482,-17]mm · 12 of 103 slices shown, 14 images]
[im 5/103  soft-tissue]
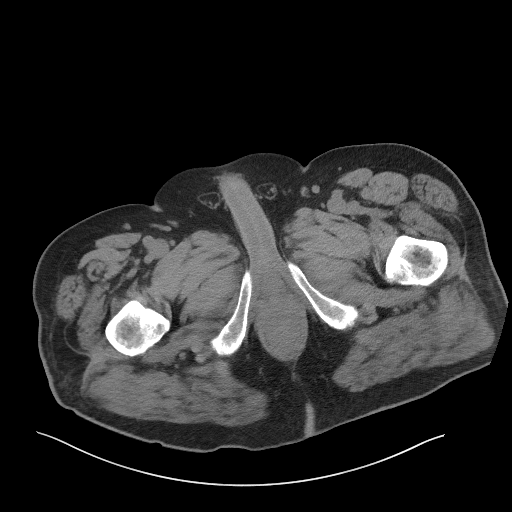
[im 5/103  bone]
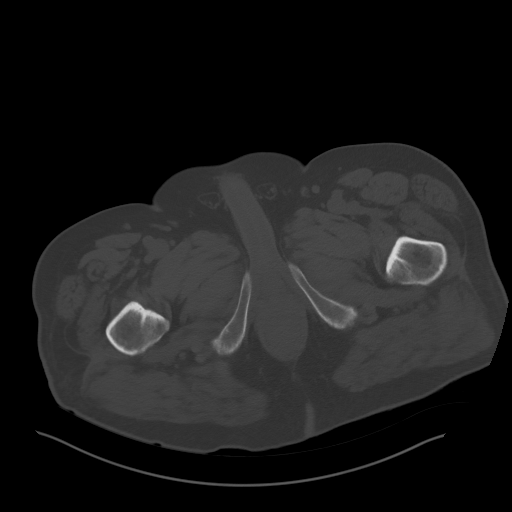
[im 13/103  soft-tissue]
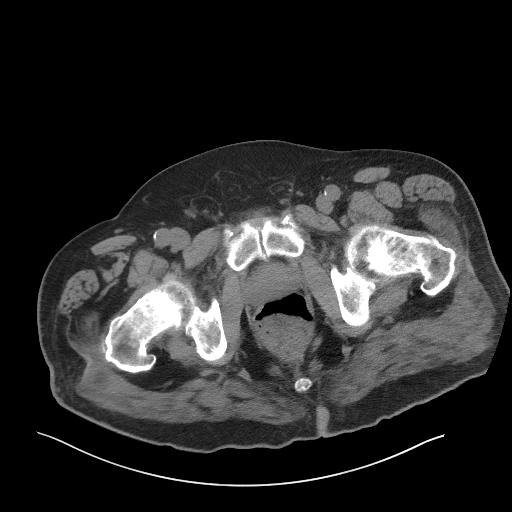
[im 22/103  soft-tissue]
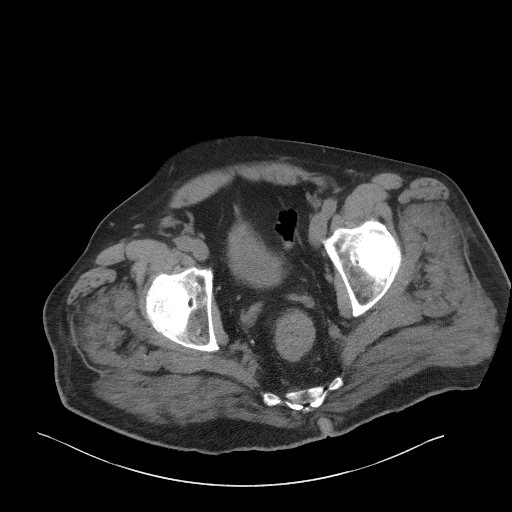
[im 30/103  soft-tissue]
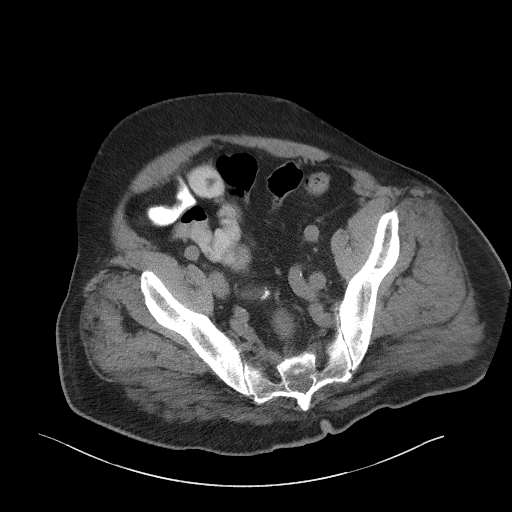
[im 39/103  soft-tissue]
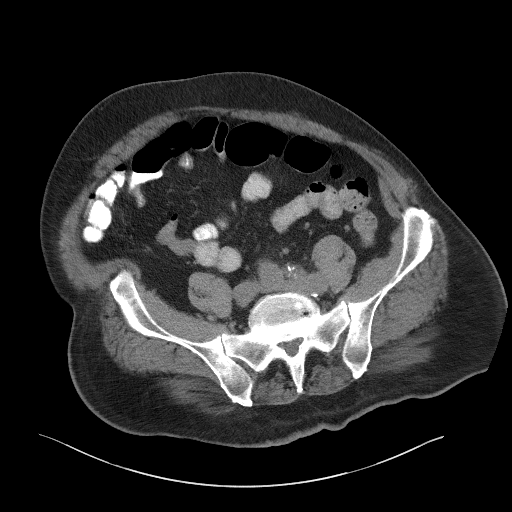
[im 47/103  soft-tissue]
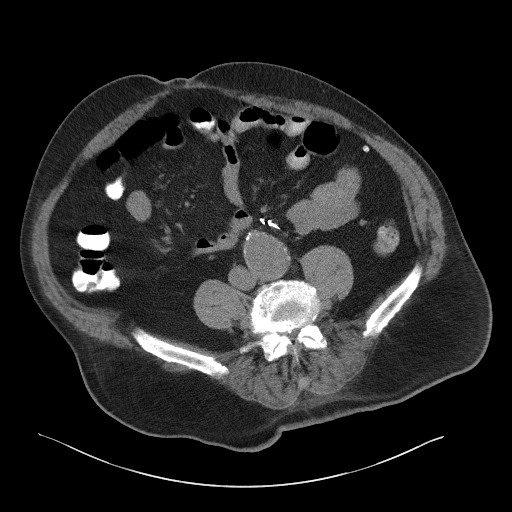
[im 56/103  soft-tissue]
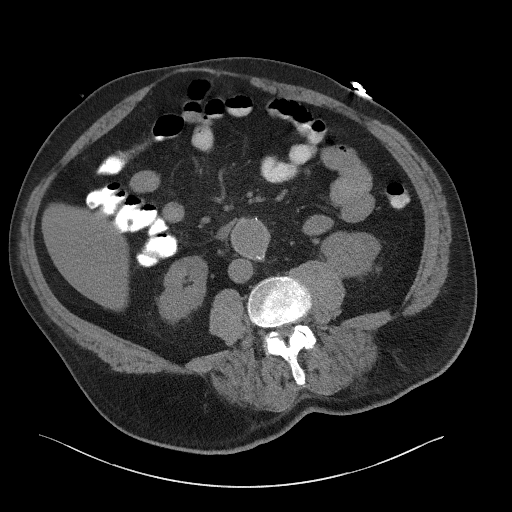
[im 64/103  soft-tissue]
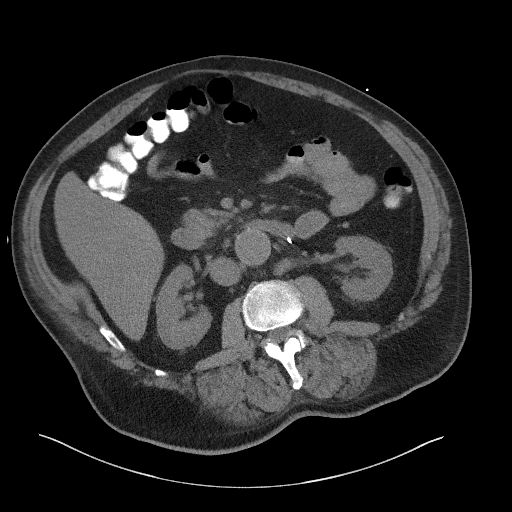
[im 73/103  soft-tissue]
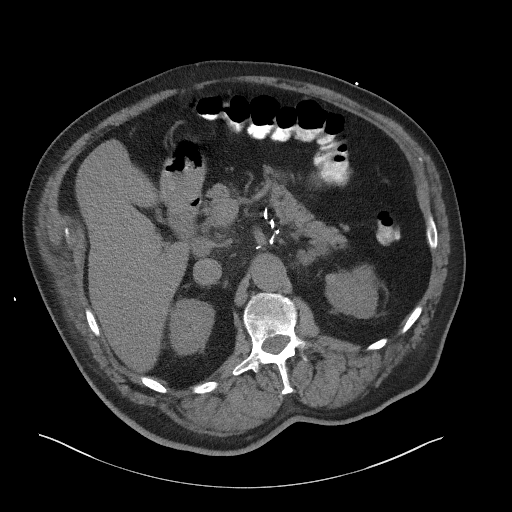
[im 73/103  bone]
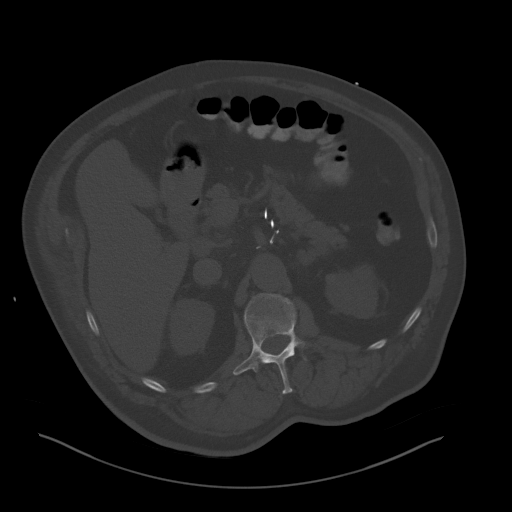
[im 81/103  soft-tissue]
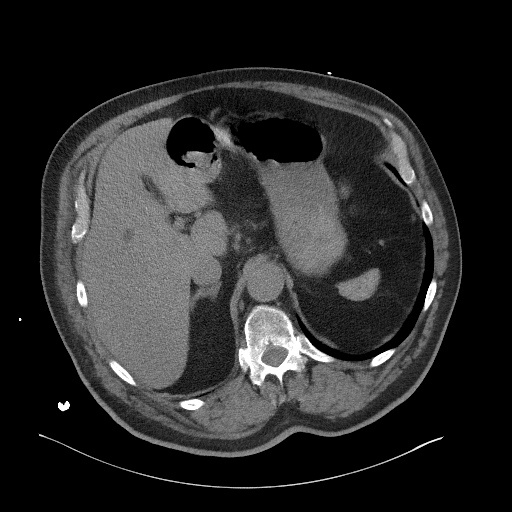
[im 90/103  soft-tissue]
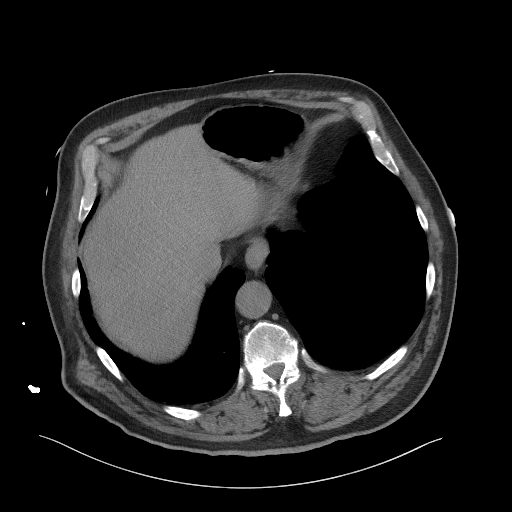
[im 98/103  soft-tissue]
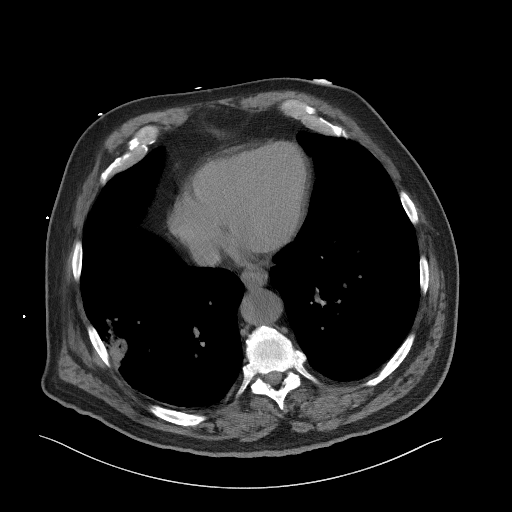

[Series 4: coronal st · coronal · 0.93mm/px · 3 of 117 slices shown]
[im 39/117  soft-tissue]
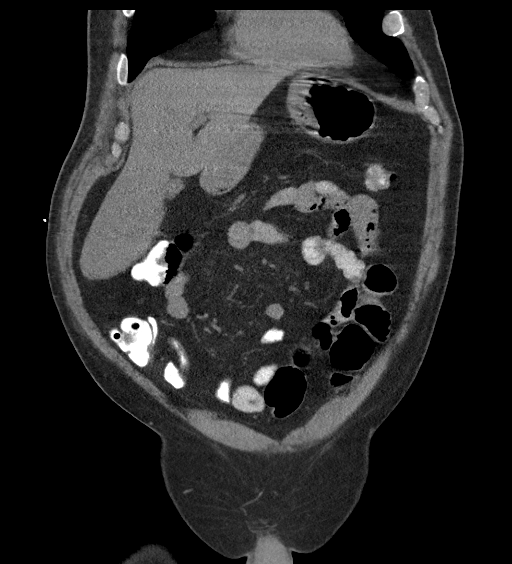
[im 52/117  soft-tissue]
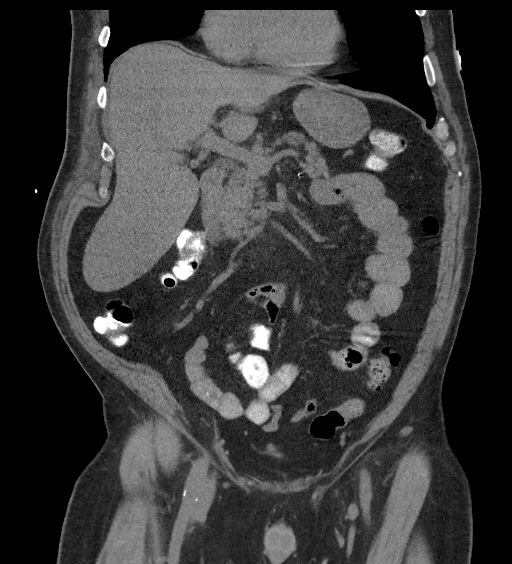
[im 65/117  soft-tissue]
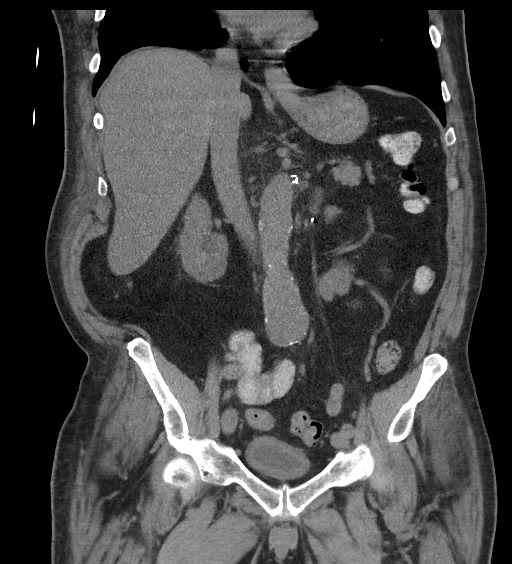

[15 of 46 positions shown; findings below may reference images not displayed]

FINDINGS: Lower chest: There is focal airspace consolidation in posterolateral
right lower lobe abutting the pleural margin measuring approximately
4.8 x 2.1 x 3.5 cm. This is new when compared to the prior exam.
Lung bases otherwise clear. Heart normal in size.

Hepatobiliary: 3 small low-density liver lesions, largest centrally
measuring 12 mm. Another subtle low-density lesion lies in the
anterior aspect of the medial segment left lobe with a third in the
lateral aspect of the posterior segment of the right lobe. These
were present previously and are consistent with cysts. No other
liver abnormality. There is dependent density in the gallbladder
consistent with small stones. No evidence of acute cholecystitis. No
bile duct dilation.

Pancreas: Subtle haziness is noted in the fat adjacent to the
pancreatic head and main portal vein. Pancreas otherwise
unremarkable with no masses. Surgical vascular clips are noted
adjacent to the pancreas and below the superior mesenteric artery,
stable from the prior study.

Spleen: Spleen is small, but stable.  No splenic masses.

Adrenals/Urinary Tract: Left adrenal gland nodular thickening with
milder thickening of the right adrenal gland, stable from the prior
study. No renal masses or stones. No hydronephrosis. Normal ureters.
Bladder is mostly decompressed but otherwise unremarkable.

Stomach/Bowel: Stomach is within normal limits. Appendix appears
normal. No evidence of bowel wall thickening, distention, or
inflammatory changes.

Vascular/Lymphatic: There is diffuse aortic ectasia. There is a
focal dilation of the infrarenal abdominal aorta just above the
bifurcation measuring a maximum of 4.6 cm from anterior-posterior,
previously 4.3 cm. There is focal dilation of the left common iliac
artery at its bifurcation measuring 3.1 cm, previously 2.8 cm.
Dilation of the right common iliac artery is noted to 2.3 cm
previously 1.9 cm. There is diffuse atherosclerotic plaque along the
abdominal aorta and iliac vessels.

There are prominent to mildly enlarged gastrohepatic ligament lymph
nodes, largest measuring 17 mm in short axis, without change from
the prior CT. No other adenopathy.

Reproductive: Unremarkable.

Other: No significant additional abnormalities in the abdomen or
pelvis.

Musculoskeletal: No acute finding. No osteoblastic or osteolytic
lesions.
IMPRESSION: 1. Right lower lobe pneumonia.  This is likely the source of sepsis.
2. No convincing acute findings within the abdomen or pelvis.
3. Small liver cyst, relatively stable from the prior CT.
4. Cholelithiasis.  No acute cholecystitis.
5. Infrarenal abdominal aortic aneurysm with aneurysm of the left
common iliac vein and ectasia of the right common iliac vein, all
mildly increased when compared the prior CT. Infrarenal abdominal
aortic aneurysm measures 4.6 cm. Recommend followup by abdomen and
pelvis CTA in 6 months, and vascular surgery referral/consultation
if not already obtained. This recommendation follows ACR consensus
guidelines: White Paper of the ACR Incidental Findings Committee II
on Vascular Findings. [HOSPITAL] [id number]; [DATE].
6. Mild reactive adenopathy along the gastrohepatic ligament stable
from the prior CT. Nodular enlargement of the adrenal glands also
stable from the prior CT.

## 2020-02-13 DEATH — deceased

## 2022-10-25 ENCOUNTER — Ambulatory Visit: Payer: Medicare HMO

## 2022-10-25 ENCOUNTER — Other Ambulatory Visit: Payer: Self-pay | Admitting: Family Medicine

## 2022-10-25 DIAGNOSIS — S8391XA Sprain of unspecified site of right knee, initial encounter: Secondary | ICD-10-CM
